# Patient Record
Sex: Male | Born: 1990 | Hispanic: No | Marital: Single | Smoking: Current some day smoker
Health system: Southern US, Community
[De-identification: ages and names within clinical notes are randomized; demographics above are authoritative.]

## PROBLEM LIST (undated history)

## (undated) HISTORY — PX: TONSILLECTOMY: SHX5217

---
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
## 2012-03-11 ENCOUNTER — Emergency Department (HOSPITAL_COMMUNITY)
Admission: EM | Admit: 2012-03-11 | Discharge: 2012-03-11 | Disposition: A | Discharge status: Home / Self Care | Payer: Self-pay | Attending: Emergency Medicine | Admitting: Emergency Medicine

## 2012-03-11 ENCOUNTER — Emergency Department (HOSPITAL_COMMUNITY): Payer: Self-pay

## 2012-03-11 ENCOUNTER — Encounter (HOSPITAL_COMMUNITY): Payer: Self-pay | Admitting: *Deleted

## 2012-03-11 DIAGNOSIS — S63509A Unspecified sprain of unspecified wrist, initial encounter: Secondary | ICD-10-CM | POA: Insufficient documentation

## 2012-03-11 DIAGNOSIS — M6740 Ganglion, unspecified site: Secondary | ICD-10-CM | POA: Insufficient documentation

## 2012-03-11 DIAGNOSIS — Y9269 Other specified industrial and construction area as the place of occurrence of the external cause: Secondary | ICD-10-CM | POA: Insufficient documentation

## 2012-03-11 DIAGNOSIS — X503XXA Overexertion from repetitive movements, initial encounter: Secondary | ICD-10-CM | POA: Insufficient documentation

## 2012-03-11 DIAGNOSIS — Y990 Civilian activity done for income or pay: Secondary | ICD-10-CM | POA: Insufficient documentation

## 2012-03-11 MED ORDER — IBUPROFEN 800 MG PO TABS: 800.0000 mg | ORAL_TABLET | Freq: Three times a day (TID) | ORAL | Status: AC

## 2012-03-11 NOTE — Discharge Instructions (Signed)
Follow up with your primary care doctor or with Dr. Merlyn Lot for further evaluation if no better in 4-5 days or if symptoms become worse.  Sprain A sprain happens when the bands of tissue that connect bones and hold joints together (ligaments) stretch too much or tear. HOME CARE  Raise (elevate) the injured area to lessen puffiness (swelling).   Put ice on the injured area.   Put ice in a plastic bag.   Place a towel between your skin and the bag.   Leave the ice on for 15 to 20 minutes, 3 to 4 times a day.   Do this for the first 24 hours or as told by your doctor.   Wear any splints, braces, castings, or elastic wraps as told by your child's doctor.   Eat healthy foods.   Ganglion A ganglion is a swelling under the skin that is filled with a thick, jelly-like substance. It is a synovial cyst. This is caused by a break (rupture) of the joint lining from the joint space. A ganglion often occurs near an area of repeated minor trauma (damage caused by an accident). Trauma may also be a repetitive movement at work or in a sport. TREATMENT  It often goes away without treatment. It may reappear later. Sometimes a ganglion may need to be surgically removed. Often they are drained and injected with a steroid. Sometimes they respond to:  Rest.   Splinting.  HOME CARE INSTRUCTIONS   Your caregiver will decide the best way of treating your ganglion. Do not try to break the ganglion yourself by pressing on it, poking it with a needle, or hitting it with a heavy object.   Use medications as directed.  SEEK MEDICAL CARE IF:   The ganglion becomes larger or more painful.   You have increased redness or swelling.   You have weakness or numbness in your hand or wrist.  MAKE SURE YOU:   Understand these instructions.   Will watch your condition.   Will get help right away if you are not doing well or get worse.  Document Released: 11/07/2000 Document Revised: 10/30/2011 Document  Reviewed: 01/04/2008 [facility] Patient Information 2012 [place], [place].  Only take medicine as told by your doctor.  GET HELP RIGHT AWAY IF:   There is numbness or tingling in the injured limb.   The toes or fingers become blue or white in the injured limb.   The sprained limb is cold to the touch.   There is a sharp, shooting pain in the injured limb.   The puffiness is getting worse instead of better.  MAKE SURE YOU:   Understand these instructions.   Will watch this condition.   Will get help right away if you are not doing well or get worse.  Document Released: 04/28/2008 Document Revised: 10/30/2011 Document Reviewed: 09/26/2009 [facility] Patient Information 2012 [place], [place].

## 2012-03-11 NOTE — ED Provider Notes (Signed)
History     CSN: [id number]  Arrival date & time 03/11/12  2033   None     Chief Complaint  Patient presents with  . Wrist Pain    pt denies known injury to right wrist. states he does repeated heavy lifting at work, states he noticed the pain started 2 days ago and reports increased pain with plantar flexion of wrist.    (Consider location/radiation/quality/duration/timing/severity/associated sxs/prior treatment) Patient is a 21 y.o. male presenting with wrist pain. The history is provided by the patient.  Wrist Pain This is a new problem. The current episode started in the past 7 days. The problem occurs constantly. The problem has been unchanged. Pertinent negatives include no numbness. Associated symptoms comments: He does heavy lifting at work and noticed increasing right wrist soreness x 2 days, now with a cyst like swelling that is painful..    Past Medical History  Diagnosis Date  . Asthma     History reviewed. No pertinent past surgical history.  History reviewed. No pertinent family history.  History  Substance Use Topics  . Smoking status: Not on file  . Smokeless tobacco: Not on file  . Alcohol Use: Yes      Review of Systems  Musculoskeletal:       See HPI  Skin: Negative for wound.  Neurological: Negative for numbness.    Allergies  Review of patient's allergies indicates no known allergies.  Home Medications   Current Outpatient Rx  Name Route Sig Dispense Refill  . CETIRIZINE HCL 10 MG PO TABS Oral Take 10 mg by mouth daily.    . IBUPROFEN 200 MG PO TABS Oral Take 600 mg by mouth every 8 (eight) hours as needed. For pain.      BP 155/98  Pulse 76  Temp(Src) 98.3 F (36.8 C) (Oral)  Resp 20  Ht 5\' 11"  (1.803 m)  Wt 193 lb 3.2 oz (87.635 kg)  BMI 26.95 kg/m2  SpO2 98%  Physical Exam  Constitutional: He is oriented to person, place, and time. He appears well-developed and well-nourished.  Pulmonary/Chest: Effort normal.    Musculoskeletal: Normal range of motion.       Right wrist without swelling, discoloration or bony deformity. FROM, with no deficits of strength or tendon function. Approximate 2 cm cyst dorsally c/w ganglion cyst.   Neurological: He is alert and oriented to person, place, and time.  Skin: Skin is warm and dry.  Psychiatric: He has a normal mood and affect.    ED Course  Procedures (including critical care time)  Labs Reviewed - No data to display Dg Wrist Complete Right  03/11/2012  *RADIOLOGY REPORT*  Clinical Data: New.  RIGHT WRIST - COMPLETE 3+ VIEW  Comparison: None.  Findings: The right wrist is located.  No acute bone or soft tissue abnormality is evident.  IMPRESSION: Negative right wrist.  Original Report Authenticated By: Jamesetta Orleans. MATTERN, M.D.     No diagnosis found. 1. Wrist sprain, mild 2. Ganglion cyst   MDM          Rodena Medin, PA-C 03/11/12 2232

## 2012-03-11 NOTE — ED Provider Notes (Signed)
Medical screening examination/treatment/procedure(s) were performed by non-physician practitioner and as supervising physician I was immediately available for consultation/collaboration.  Cheri Guppy, MD 03/11/12 [phone]

## 2013-12-09 IMAGING — CR DG WRIST COMPLETE 3+V*R*
4 series · 4 of 4 positions shown · non-contrast
Comparison: None.

CLINICAL DATA: New.

RIGHT WRIST - COMPLETE 3+ VIEW

[x wrist pa right]
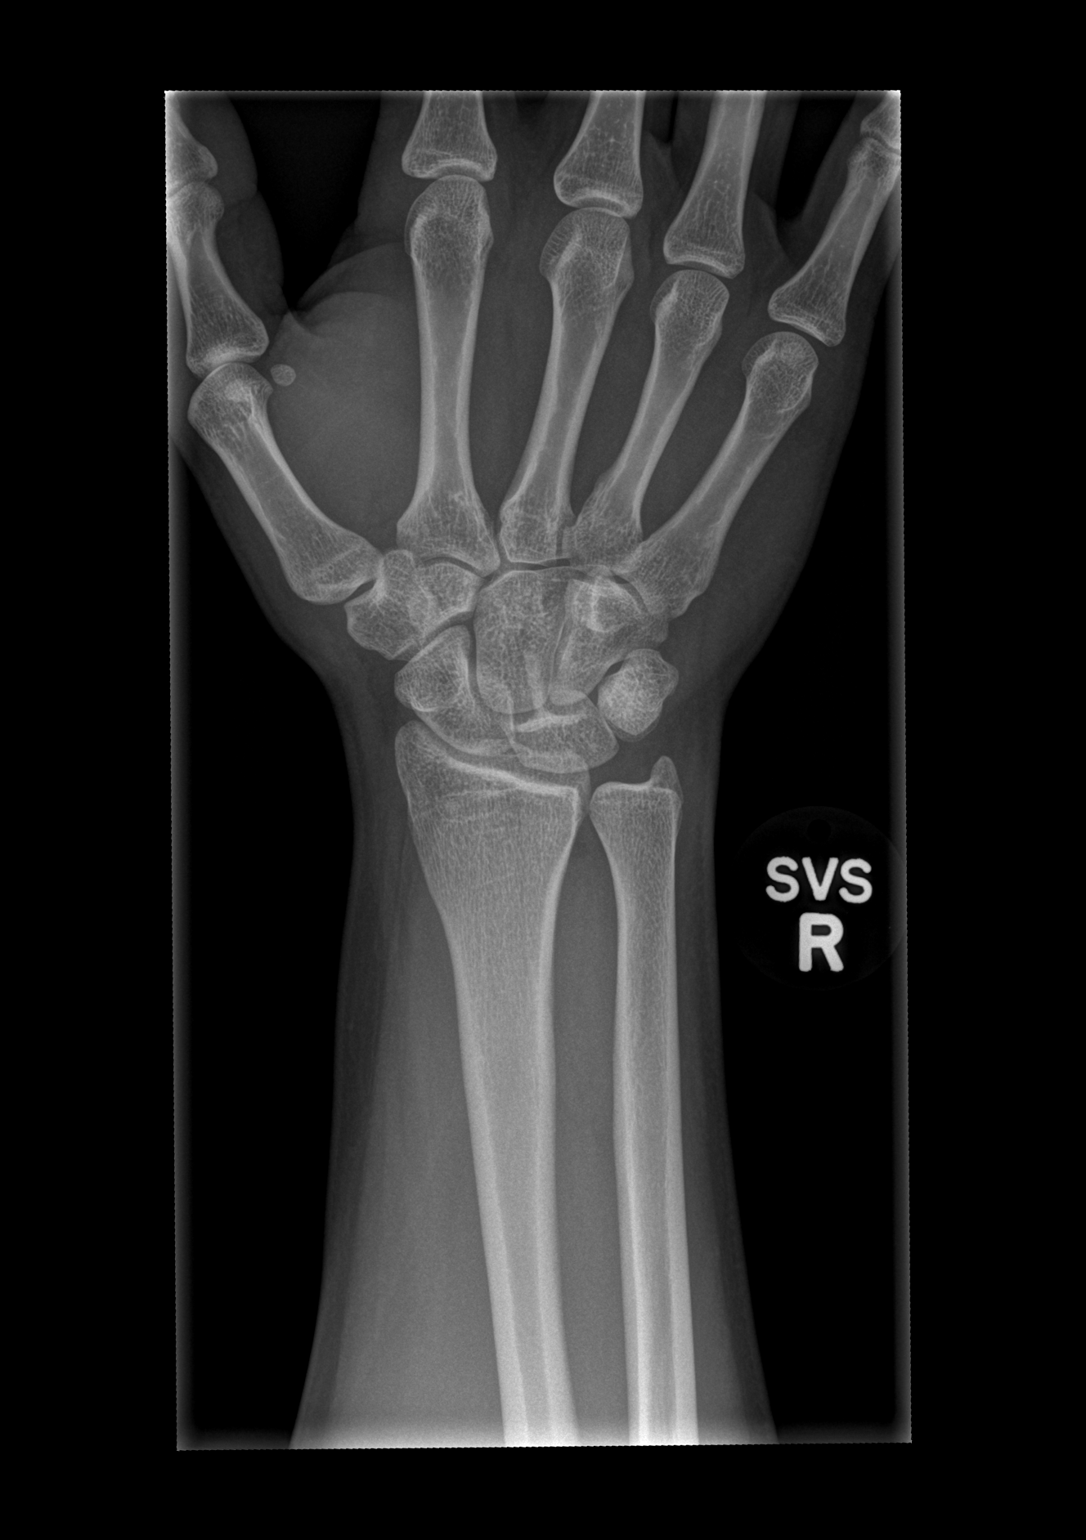

[x wrist obl right]
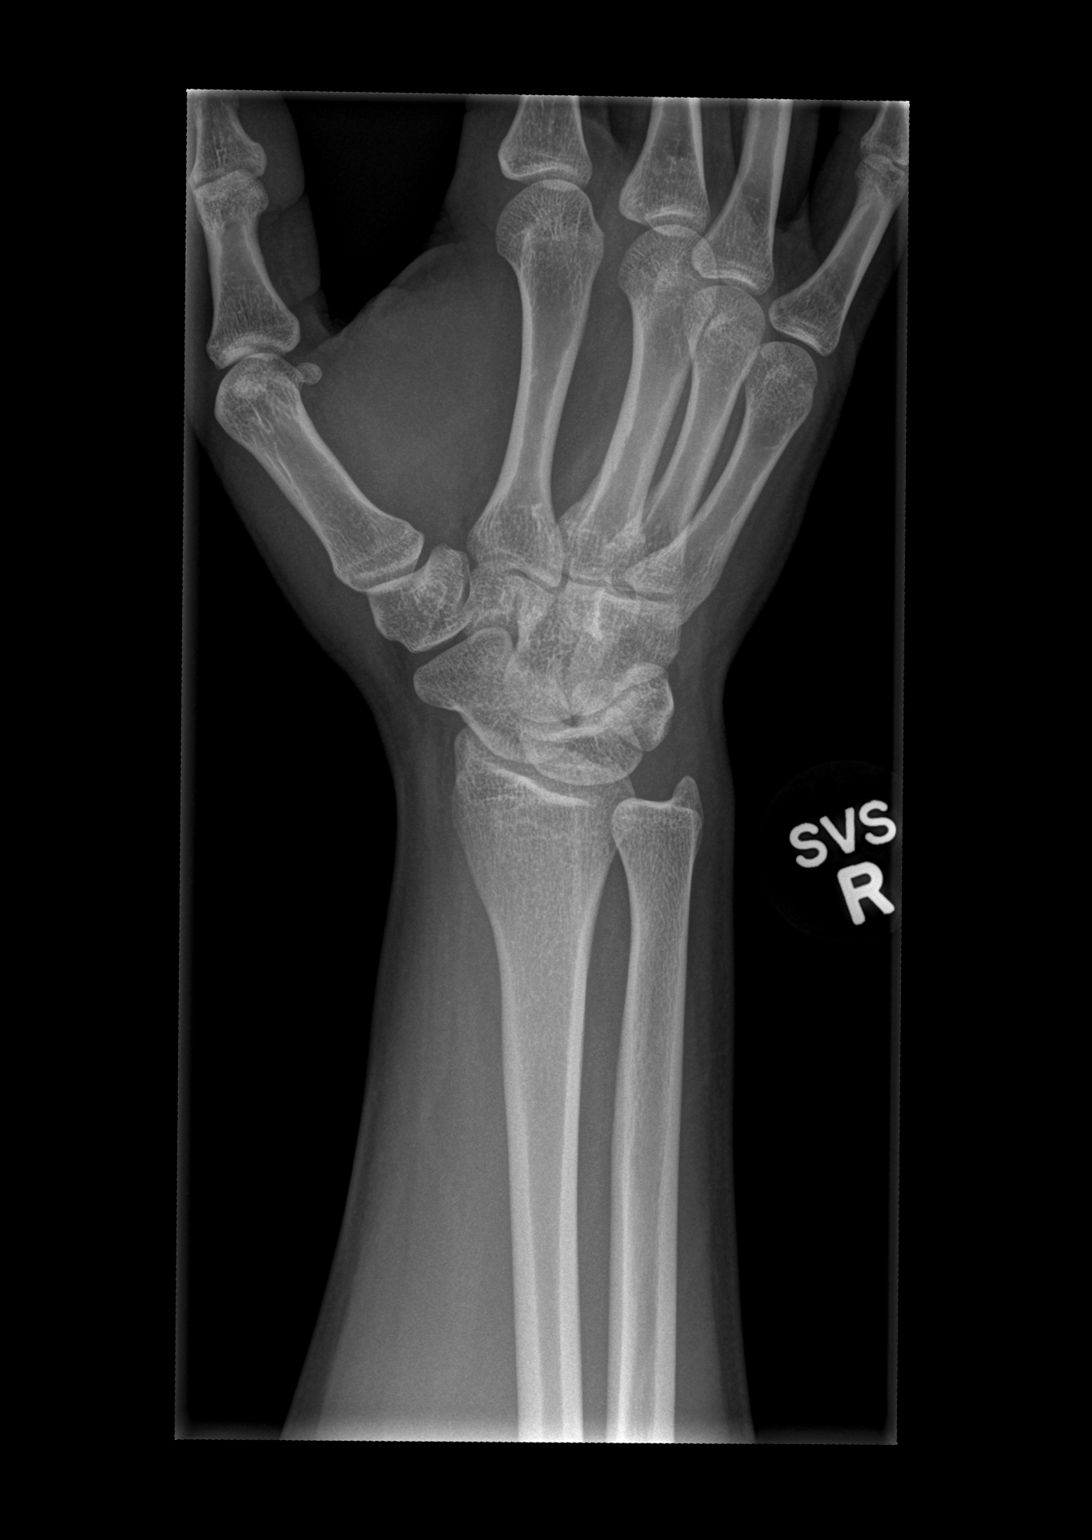

[x wrist lat right]
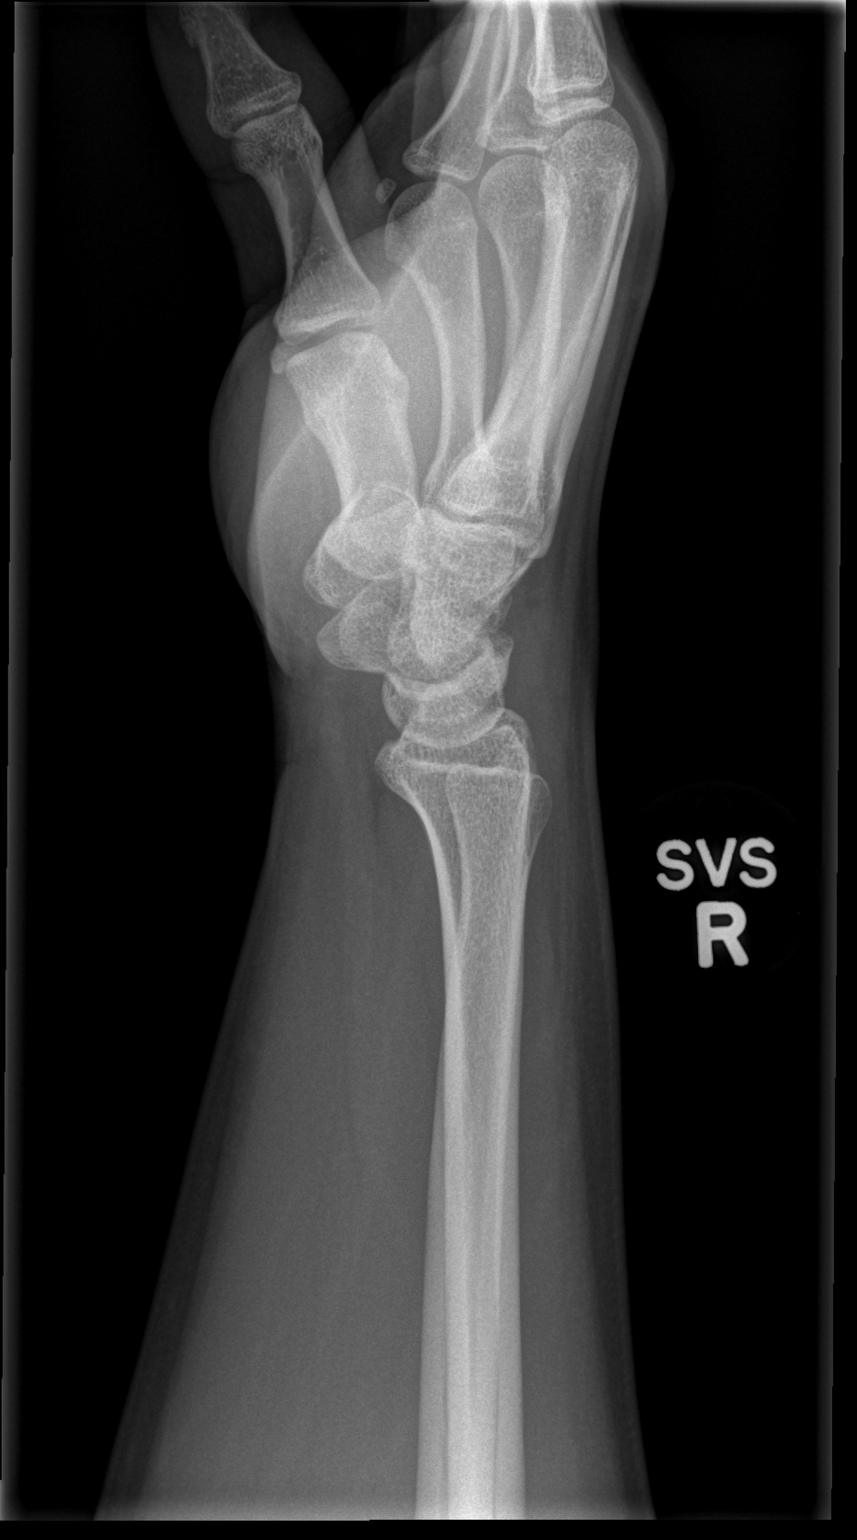

[x wrist navicular view right]
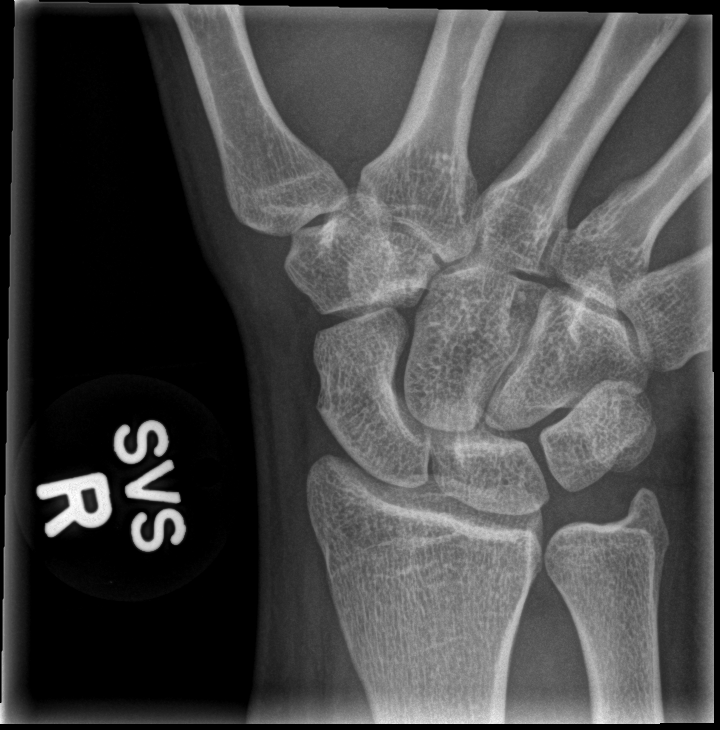

[4 of 4 positions shown; findings below may reference images not displayed]

FINDINGS: The right wrist is located.  No acute bone or soft tissue
abnormality is evident.
IMPRESSION: Negative right wrist.

## 2019-09-12 ENCOUNTER — Other Ambulatory Visit: Payer: Self-pay

## 2019-09-12 DIAGNOSIS — Z20822 Contact with and (suspected) exposure to covid-19: Secondary | ICD-10-CM

## 2019-09-14 LAB — NOVEL CORONAVIRUS, NAA: SARS-CoV-2, NAA: NOT DETECTED

## 2019-09-16 ENCOUNTER — Other Ambulatory Visit: Payer: Self-pay

## 2019-09-16 DIAGNOSIS — Z20822 Contact with and (suspected) exposure to covid-19: Secondary | ICD-10-CM

## 2019-09-17 LAB — NOVEL CORONAVIRUS, NAA: SARS-CoV-2, NAA: NOT DETECTED

## 2019-09-30 ENCOUNTER — Other Ambulatory Visit: Payer: Self-pay

## 2019-09-30 DIAGNOSIS — Z20822 Contact with and (suspected) exposure to covid-19: Secondary | ICD-10-CM

## 2019-10-01 LAB — NOVEL CORONAVIRUS, NAA: SARS-CoV-2, NAA: NOT DETECTED

## 2021-12-27 ENCOUNTER — Other Ambulatory Visit (HOSPITAL_BASED_OUTPATIENT_CLINIC_OR_DEPARTMENT_OTHER): Payer: Self-pay

## 2021-12-27 DIAGNOSIS — R0683 Snoring: Secondary | ICD-10-CM

## 2022-02-10 ENCOUNTER — Ambulatory Visit (HOSPITAL_BASED_OUTPATIENT_CLINIC_OR_DEPARTMENT_OTHER): Payer: Self-pay | Attending: Physician Assistant | Admitting: Internal Medicine

## 2022-07-17 ENCOUNTER — Ambulatory Visit (HOSPITAL_COMMUNITY): Payer: Self-pay | Admitting: Psychiatry

## 2022-08-26 ENCOUNTER — Ambulatory Visit (HOSPITAL_BASED_OUTPATIENT_CLINIC_OR_DEPARTMENT_OTHER): Payer: Commercial Managed Care - HMO | Admitting: Psychiatry

## 2022-08-26 ENCOUNTER — Encounter (HOSPITAL_COMMUNITY): Payer: Self-pay | Admitting: Psychiatry

## 2022-08-26 VITALS — Wt 200.0 lb

## 2022-08-26 DIAGNOSIS — T1490XA Injury, unspecified, initial encounter: Secondary | ICD-10-CM

## 2022-08-26 DIAGNOSIS — F331 Major depressive disorder, recurrent, moderate: Secondary | ICD-10-CM | POA: Diagnosis not present

## 2022-08-26 DIAGNOSIS — F4310 Post-traumatic stress disorder, unspecified: Secondary | ICD-10-CM

## 2022-08-26 MED ORDER — BUPROPION HCL ER (XL) 300 MG PO TB24: 300.0000 mg | ORAL_TABLET | ORAL | 1 refills | Status: DC

## 2022-08-26 MED ORDER — HYDROXYZINE HCL 10 MG PO TABS: ORAL_TABLET | ORAL | 0 refills | Status: DC
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
## 2022-09-15 ENCOUNTER — Telehealth (HOSPITAL_COMMUNITY): Payer: Commercial Managed Care - HMO | Admitting: Psychiatry

## 2022-09-22 ENCOUNTER — Encounter (HOSPITAL_COMMUNITY): Payer: Self-pay | Admitting: Psychiatry

## 2022-09-22 ENCOUNTER — Telehealth (HOSPITAL_BASED_OUTPATIENT_CLINIC_OR_DEPARTMENT_OTHER): Payer: Commercial Managed Care - HMO | Admitting: Psychiatry

## 2022-09-22 VITALS — Wt 200.0 lb

## 2022-09-22 DIAGNOSIS — F4310 Post-traumatic stress disorder, unspecified: Secondary | ICD-10-CM

## 2022-09-22 DIAGNOSIS — F331 Major depressive disorder, recurrent, moderate: Secondary | ICD-10-CM

## 2022-09-22 MED ORDER — BUPROPION HCL ER (XL) 300 MG PO TB24: 300.0000 mg | ORAL_TABLET | ORAL | 1 refills | Status: DC

## 2022-09-22 MED ORDER — HYDROXYZINE HCL 10 MG PO TABS: ORAL_TABLET | ORAL | 1 refills | Status: DC
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
## 2022-11-03 ENCOUNTER — Telehealth (HOSPITAL_COMMUNITY): Payer: Commercial Managed Care - HMO | Admitting: Psychiatry

## 2022-11-04 ENCOUNTER — Telehealth (HOSPITAL_COMMUNITY): Payer: Commercial Managed Care - HMO | Admitting: Psychiatry

## 2022-11-11 ENCOUNTER — Encounter (HOSPITAL_COMMUNITY): Payer: Self-pay | Admitting: Psychiatry

## 2022-11-11 ENCOUNTER — Telehealth (HOSPITAL_BASED_OUTPATIENT_CLINIC_OR_DEPARTMENT_OTHER): Payer: Commercial Managed Care - HMO | Admitting: Psychiatry

## 2022-11-11 VITALS — Wt 200.0 lb

## 2022-11-11 DIAGNOSIS — F4310 Post-traumatic stress disorder, unspecified: Secondary | ICD-10-CM

## 2022-11-11 DIAGNOSIS — F1010 Alcohol abuse, uncomplicated: Secondary | ICD-10-CM | POA: Diagnosis not present

## 2022-11-11 DIAGNOSIS — F331 Major depressive disorder, recurrent, moderate: Secondary | ICD-10-CM | POA: Diagnosis not present

## 2022-11-11 MED ORDER — BUPROPION HCL ER (XL) 150 MG PO TB24: 150.0000 mg | ORAL_TABLET | ORAL | 0 refills | Status: DC

## 2022-11-11 MED ORDER — LAMOTRIGINE 25 MG PO TABS: ORAL_TABLET | ORAL | 0 refills | Status: DC
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
## 2022-12-06 ENCOUNTER — Encounter (HOSPITAL_BASED_OUTPATIENT_CLINIC_OR_DEPARTMENT_OTHER): Payer: Self-pay | Admitting: Radiology

## 2022-12-06 ENCOUNTER — Other Ambulatory Visit: Payer: Self-pay

## 2022-12-06 ENCOUNTER — Emergency Department (HOSPITAL_BASED_OUTPATIENT_CLINIC_OR_DEPARTMENT_OTHER)
Admission: EM | Admit: 2022-12-06 | Discharge: 2022-12-06 | Disposition: A | Discharge status: Home / Self Care | Payer: No Typology Code available for payment source | Attending: Emergency Medicine | Admitting: Emergency Medicine

## 2022-12-06 ENCOUNTER — Other Ambulatory Visit (HOSPITAL_COMMUNITY): Payer: Self-pay | Admitting: Psychiatry

## 2022-12-06 DIAGNOSIS — F4310 Post-traumatic stress disorder, unspecified: Secondary | ICD-10-CM

## 2022-12-06 DIAGNOSIS — K047 Periapical abscess without sinus: Secondary | ICD-10-CM | POA: Insufficient documentation

## 2022-12-06 DIAGNOSIS — R220 Localized swelling, mass and lump, head: Secondary | ICD-10-CM | POA: Diagnosis present

## 2022-12-06 DIAGNOSIS — F331 Major depressive disorder, recurrent, moderate: Secondary | ICD-10-CM

## 2022-12-06 MED ORDER — IBUPROFEN 400 MG PO TABS
600.0000 mg | ORAL_TABLET | Freq: Once | ORAL | Status: AC
  Administered 2022-12-06: 600 mg via ORAL
  Filled 2022-12-06: qty 1

## 2022-12-06 MED ORDER — AMOXICILLIN-POT CLAVULANATE 875-125 MG PO TABS: 1.0000 | ORAL_TABLET | Freq: Two times a day (BID) | ORAL | 0 refills | Status: DC

## 2022-12-06 MED ORDER — AMOXICILLIN-POT CLAVULANATE 875-125 MG PO TABS
1.0000 | ORAL_TABLET | Freq: Once | ORAL | Status: AC
  Administered 2022-12-06: 1 via ORAL
  Filled 2022-12-06: qty 1

## 2022-12-06 MED ORDER — IBUPROFEN 600 MG PO TABS: 600.0000 mg | ORAL_TABLET | Freq: Four times a day (QID) | ORAL | 0 refills | Status: AC | PRN

## 2022-12-06 NOTE — ED Provider Notes (Signed)
[facility] DEPT  Provider Note  CSN: [id number] Arrival date & time: 12/06/22 0136  History Chief Complaint  Patient presents with   Oral Swelling    Mitchell Skinner is a 32 y.o. male reports a sore on the roof of his mouth, adjacent to his L 3rd molar onset about 24 hours ago with significant pain. The sore burst while in the waiting room and pus came out. He reports long standing issues with what he believes is sleep apnea and sore throat. No fever tonight.    Home Medications Prior to Admission medications   Medication Sig Start Date End Date Taking? Authorizing Provider  amoxicillin-clavulanate (AUGMENTIN) 875-125 MG tablet Take 1 tablet by mouth every 12 (twelve) hours. 12/06/22  Yes Truddie Hidden, MD  ibuprofen (ADVIL) 600 MG tablet Take 1 tablet (600 mg total) by mouth every 6 (six) hours as needed. 12/06/22  Yes Truddie Hidden, MD  buPROPion (WELLBUTRIN XL) 150 MG 24 hr tablet Take 1 tablet (150 mg total) by mouth every morning. 11/11/22 11/11/23  Arfeen, Arlyce Harman, MD  cetirizine (ZYRTEC) 10 MG tablet Take 10 mg by mouth daily.    [provider]  hydrOXYzine (ATARAX) 10 MG tablet Take one to two tab at bed time. 09/22/22   Arfeen, Arlyce Harman, MD  lamoTRIgine (LAMICTAL) 25 MG tablet Take one tab daily for one week and than two tab daily 11/11/22   Arfeen, Arlyce Harman, MD  loratadine (CLARITIN) 10 MG tablet Take by mouth.    [provider]     Allergies    Cherry   Review of Systems   Review of Systems Please see HPI for pertinent positives and negatives  Physical Exam BP (!) 123/94 (BP Location: Right Arm)   Pulse 95   Temp 98.2 F (36.8 C) (Oral)   Resp 18   Ht 5\' 10"  (1.778 m)   Wt 104.3 kg   SpO2 99%   BMI 33.00 kg/m   Physical Exam Vitals and nursing note reviewed.  HENT:     Head: Normocephalic.     Nose: Nose normal.     Mouth/Throat:     Comments: Soft tissue erythema and induration without fluctuance on  his hard palate adjacent to the L upper 3rd molar which is also tender Eyes:     Extraocular Movements: Extraocular movements intact.  Pulmonary:     Effort: Pulmonary effort is normal.     Breath sounds: No stridor.  Musculoskeletal:        General: Normal range of motion.     Cervical back: Neck supple.  Skin:    Findings: No rash (on exposed skin).  Neurological:     Mental Status: He is alert and oriented to person, place, and time.  Psychiatric:        Mood and Affect: Mood normal.     ED Results / Procedures / Treatments   EKG None  Procedures Procedures  Medications Ordered in the ED Medications  ibuprofen (ADVIL) tablet 600 mg (has no administration in time range)  amoxicillin-clavulanate (AUGMENTIN) 875-125 MG per tablet 1 tablet (has no administration in time range)    Initial Impression and Plan  Patient here with apparent dental abscess which has spontaneously drained. Plan Rx for motrin and Abx. Dental follow up. RTED for any other concerns.   ED Course       MDM Rules/Calculators/A&P Medical Decision Making Problems Addressed: Dental abscess: acute illness or injury  Risk  Prescription drug management.    Final Clinical Impression(s) / ED Diagnoses Final diagnoses:  Dental abscess    Rx / DC Orders ED Discharge Orders          Ordered    amoxicillin-clavulanate (AUGMENTIN) 875-125 MG tablet  Every 12 hours        12/06/22 0241    ibuprofen (ADVIL) 600 MG tablet  Every 6 hours PRN        12/06/22 0241             Truddie Hidden, MD 12/06/22 [phone]

## 2022-12-06 NOTE — ED Triage Notes (Signed)
Pt has a sore to his left upper mouth. States it was a puss pocket that burst while he was waiting in the lobby. Pt states its affecting his speech and making his throat hurt.

## 2022-12-09 ENCOUNTER — Encounter (HOSPITAL_COMMUNITY): Payer: Self-pay | Admitting: Psychiatry

## 2022-12-09 ENCOUNTER — Telehealth (HOSPITAL_BASED_OUTPATIENT_CLINIC_OR_DEPARTMENT_OTHER): Payer: No Typology Code available for payment source | Admitting: Psychiatry

## 2022-12-09 DIAGNOSIS — F4310 Post-traumatic stress disorder, unspecified: Secondary | ICD-10-CM | POA: Diagnosis not present

## 2022-12-09 DIAGNOSIS — F331 Major depressive disorder, recurrent, moderate: Secondary | ICD-10-CM

## 2022-12-09 MED ORDER — BUPROPION HCL ER (XL) 150 MG PO TB24: 150.0000 mg | ORAL_TABLET | ORAL | 1 refills | Status: DC
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
## 2022-12-10 ENCOUNTER — Telehealth (HOSPITAL_COMMUNITY): Payer: Commercial Managed Care - HMO | Admitting: Psychiatry

## 2023-02-09 ENCOUNTER — Telehealth (HOSPITAL_BASED_OUTPATIENT_CLINIC_OR_DEPARTMENT_OTHER): Payer: No Typology Code available for payment source | Admitting: Psychiatry

## 2023-02-09 DIAGNOSIS — F331 Major depressive disorder, recurrent, moderate: Secondary | ICD-10-CM | POA: Diagnosis not present

## 2023-02-09 DIAGNOSIS — F1010 Alcohol abuse, uncomplicated: Secondary | ICD-10-CM | POA: Diagnosis not present

## 2023-02-09 DIAGNOSIS — F4310 Post-traumatic stress disorder, unspecified: Secondary | ICD-10-CM

## 2023-02-09 MED ORDER — BUPROPION HCL ER (XL) 150 MG PO TB24: 150.0000 mg | ORAL_TABLET | ORAL | 1 refills | Status: DC

## 2023-02-09 MED ORDER — PRAZOSIN HCL 1 MG PO CAPS: 1.0000 mg | ORAL_CAPSULE | Freq: Every day | ORAL | 1 refills | Status: DC
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
## 2023-02-12 ENCOUNTER — Telehealth: Payer: No Typology Code available for payment source | Admitting: Physician Assistant

## 2023-02-12 DIAGNOSIS — F1010 Alcohol abuse, uncomplicated: Secondary | ICD-10-CM

## 2023-02-12 NOTE — Progress Notes (Signed)
Virtual Visit Consent   Seng Polishchuk. Hawes, you are scheduled for a virtual visit with a [facility] provider today. Just as with appointments in the office, your consent must be obtained to participate. Your consent will be active for this visit and any virtual visit you may have with one of our providers in the next 365 days. If you have a MyChart account, a copy of this consent can be sent to you electronically.  As this is a virtual visit, video technology does not allow for your provider to perform a traditional examination. This may limit your provider's ability to fully assess your condition. If your provider identifies any concerns that need to be evaluated in person or the need to arrange testing (such as labs, EKG, etc.), we will make arrangements to do so. Although advances in technology are sophisticated, we cannot ensure that it will always work on either your end or our end. If the connection with a video visit is poor, the visit may have to be switched to a telephone visit. With either a video or telephone visit, we are not always able to ensure that we have a secure connection.  By engaging in this virtual visit, you consent to the provision of healthcare and authorize for your insurance to be billed (if applicable) for the services provided during this visit. Depending on your insurance coverage, you may receive a charge related to this service.  I need to obtain your verbal consent now. Are you willing to proceed with your visit today? Mitchell Clam Nafus has provided verbal consent on 02/12/2023 for a virtual visit (video or telephone). Mitchell Skinner, [place]  Date: 02/12/2023 1:48 PM  Virtual Visit via Video Note   I, Mitchell Skinner, connected with  Mitchell Clam. Podgorny  ([id number], [birth year]) on 02/12/23 at  1:30 PM EDT by a video-enabled telemedicine application and verified that I am speaking with the correct person using two identifiers.  Location: Patient: Virtual Visit  Location Patient: Home Provider: Virtual Visit Location Provider: Home Office   I discussed the limitations of evaluation and management by telemedicine and the availability of in person appointments. The patient expressed understanding and agreed to proceed.    History of Present Illness: Mitchell Skinner is a 32 y.o. who identifies as a male who was assigned male at birth, and is being seen today for alcohol misuse in relation to worsening depression. Notes his fiance left him recently which has caused substantial increase in depressive symptoms, although he denies SI/HI. Notes he has been drinking a lot for the past 6 months, including the need to take shots before going to work in order to "start his day". Over the past 3 days endorses drinking a handle of liquor daily (~1.75L). Notes no appetite. Sleep is very poor. Was evaluated by his Psychiatrist (Dr. Adele Schilder) on 3/18 at which time it was noted he had been out of Wellbutrin. This was refilled along with addition of Prazosin to help with his nightmares..  States he has not taken the Prazosin. Is no longer taking his Lamotrigine. Picked up Wellbutrin refill yesterday but now cannot find it. Notes if he goes a while without a drink he will get tremors Denies any AMS. Giving drinking a close friend removed sharp objects and his handgun to be cautious. Notes he did not notify his specialist about how substantial alcohol use was.  HPI: HPI  Problems: There are no problems to display for this patient.   Allergies:  Allergies  Allergen Reactions   Cherry Hives   Medications:  Current Outpatient Medications:    amoxicillin-clavulanate (AUGMENTIN) 875-125 MG tablet, Take 1 tablet by mouth every 12 (twelve) hours., Disp: 14 tablet, Rfl: 0   buPROPion (WELLBUTRIN XL) 150 MG 24 hr tablet, Take 1 tablet (150 mg total) by mouth every morning., Disp: 30 tablet, Rfl: 1   cetirizine (ZYRTEC) 10 MG tablet, Take 10 mg by mouth daily., Disp: , Rfl:     ibuprofen (ADVIL) 600 MG tablet, Take 1 tablet (600 mg total) by mouth every 6 (six) hours as needed., Disp: 30 tablet, Rfl: 0   loratadine (CLARITIN) 10 MG tablet, Take by mouth., Disp: , Rfl:    prazosin (MINIPRESS) 1 MG capsule, Take 1 capsule (1 mg total) by mouth at bedtime., Disp: 30 capsule, Rfl: 1  Observations/Objective: Patient is well-developed, well-nourished in no acute distress.  Resting comfortably  at home.  Head is normocephalic, atraumatic.  No labored breathing.  Speech is clear and coherent with logical content.  Patient is alert and oriented at baseline.   Assessment and Plan: 1. Alcohol abuse  Associated with an adjustment reaction and his chronic MDD. Substantial use with high risk of withdrawals if trying to cut out on his own. Needs supervision. Want him to first reach out to his Psychiatrist for initial instructions and to help get him in a proper program. ER precautions reviewed.   Follow Up Instructions: I discussed the assessment and treatment plan with the patient. The patient was provided an opportunity to ask questions and all were answered. The patient agreed with the plan and demonstrated an understanding of the instructions.  A copy of instructions were sent to the patient via MyChart unless otherwise noted below.   The patient was advised to call back or seek an in-person evaluation if the symptoms worsen or if the condition fails to improve as anticipated.  Time:  I spent 12 minutes with the patient via telehealth technology discussing the above problems/concerns.    Mitchell Rio, PA-C

## 2023-02-12 NOTE — Patient Instructions (Signed)
  Lanier Clam Marin Shutter, thank you for joining [name], PA-C for today's virtual visit.  While this provider is not your primary care provider (PCP), if your PCP is located in our provider database this encounter information will be shared with them immediately following your visit.   [facility] account gives you access to today's visit and all your visits, tests, and labs performed at [facility] " click here if you don't have a [facility] account or go to [email or web address]  Consent: (Patient) Mitchell Skinner. Riskin provided verbal consent for this virtual visit at the beginning of the encounter.  Current Medications:  Current Outpatient Medications:    amoxicillin-clavulanate (AUGMENTIN) 875-125 MG tablet, Take 1 tablet by mouth every 12 (twelve) hours., Disp: 14 tablet, Rfl: 0   buPROPion (WELLBUTRIN XL) 150 MG 24 hr tablet, Take 1 tablet (150 mg total) by mouth every morning., Disp: 30 tablet, Rfl: 1   cetirizine (ZYRTEC) 10 MG tablet, Take 10 mg by mouth daily., Disp: , Rfl:    hydrOXYzine (ATARAX) 10 MG tablet, Take one to two tab at bed time., Disp: 60 tablet, Rfl: 1   ibuprofen (ADVIL) 600 MG tablet, Take 1 tablet (600 mg total) by mouth every 6 (six) hours as needed., Disp: 30 tablet, Rfl: 0   lamoTRIgine (LAMICTAL) 25 MG tablet, Take one tab daily for one week and than two tab daily, Disp: 60 tablet, Rfl: 0   loratadine (CLARITIN) 10 MG tablet, Take by mouth., Disp: , Rfl:    prazosin (MINIPRESS) 1 MG capsule, Take 1 capsule (1 mg total) by mouth at bedtime., Disp: 30 capsule, Rfl: 1   Medications ordered in this encounter:  No orders of the defined types were placed in this encounter.    *If you need refills on other medications prior to your next appointment, please contact your pharmacy*  Follow-Up: Call back or seek an in-person evaluation if the symptoms worsen or if the condition fails to improve as anticipated.  [facility] [phone]  Other Instructions Please contact Dr. Darletta Moll office immediately at [phone] to let them know (1) about loss of Wellbutrin (Bupropion) and (2) the substantial amount of alcohol you are consuming so they can get you treated. This is above the scope of what can be done via a video urgent care visit. If they are unable to get you taken care of, I want you to be evaluated at the nearest ER due to high risk of withdrawals from trying to cut out the drinking yourself.   [facility] [place] [facility] [phone] [facility] [phone]     If you have been instructed to have an in-person evaluation today at a local Urgent Care facility, please use the link below. It will take you to a list of all of our available [facility], including address, phone number and hours of operation. Please do not delay care.  [facility]  If you or a family member do not have a primary care provider, use the link below to schedule a visit and establish care. When you choose a [facility] primary care physician or advanced practice provider, you gain a long-term partner in health. Find a Primary Care Provider  Learn more about [facility]'s in-office and virtual care options: [facility]

## 2023-02-14 ENCOUNTER — Ambulatory Visit (HOSPITAL_COMMUNITY)
Admission: EM | Admit: 2023-02-14 | Discharge: 2023-02-14 | Disposition: A | Discharge status: Other Acute Inpatient Hospital | Payer: No Typology Code available for payment source | Attending: Urology | Admitting: Urology

## 2023-02-14 ENCOUNTER — Other Ambulatory Visit: Payer: Self-pay

## 2023-02-14 ENCOUNTER — Encounter (HOSPITAL_COMMUNITY): Payer: Self-pay

## 2023-02-14 ENCOUNTER — Emergency Department (HOSPITAL_COMMUNITY)
Admission: EM | Admit: 2023-02-14 | Discharge: 2023-02-15 | Disposition: A | Discharge status: Other Acute Inpatient Hospital | Payer: No Typology Code available for payment source | Attending: Emergency Medicine | Admitting: Emergency Medicine

## 2023-02-14 DIAGNOSIS — F1020 Alcohol dependence, uncomplicated: Secondary | ICD-10-CM

## 2023-02-14 DIAGNOSIS — F10929 Alcohol use, unspecified with intoxication, unspecified: Secondary | ICD-10-CM

## 2023-02-14 DIAGNOSIS — Z20822 Contact with and (suspected) exposure to covid-19: Secondary | ICD-10-CM | POA: Insufficient documentation

## 2023-02-14 DIAGNOSIS — Z811 Family history of alcohol abuse and dependence: Secondary | ICD-10-CM | POA: Diagnosis not present

## 2023-02-14 DIAGNOSIS — R112 Nausea with vomiting, unspecified: Secondary | ICD-10-CM | POA: Diagnosis present

## 2023-02-14 DIAGNOSIS — Z818 Family history of other mental and behavioral disorders: Secondary | ICD-10-CM | POA: Diagnosis not present

## 2023-02-14 DIAGNOSIS — E876 Hypokalemia: Secondary | ICD-10-CM | POA: Diagnosis not present

## 2023-02-14 DIAGNOSIS — F332 Major depressive disorder, recurrent severe without psychotic features: Secondary | ICD-10-CM | POA: Diagnosis not present

## 2023-02-14 DIAGNOSIS — F10220 Alcohol dependence with intoxication, uncomplicated: Secondary | ICD-10-CM | POA: Insufficient documentation

## 2023-02-14 LAB — COMPREHENSIVE METABOLIC PANEL
ALT: 67 U/L — ABNORMAL HIGH (ref 0–44)
AST: 119 U/L — ABNORMAL HIGH (ref 15–41)
Albumin: 4.9 g/dL (ref 3.5–5.0)
Alkaline Phosphatase: 103 U/L (ref 38–126)
Anion gap: 15 (ref 5–15)
BUN: 10 mg/dL (ref 6–20)
CO2: 23 mmol/L (ref 22–32)
Calcium: 9.2 mg/dL (ref 8.9–10.3)
Chloride: 99 mmol/L (ref 98–111)
Creatinine, Ser: 0.83 mg/dL (ref 0.61–1.24)
GFR, Estimated: >60 mL/min (ref >60)
Glucose, Bld: 110 mg/dL — ABNORMAL HIGH (ref 70–99)
Potassium: 3.1 mmol/L — ABNORMAL LOW (ref 3.5–5.1)
Sodium: 137 mmol/L (ref 135–145)
Total Bilirubin: 1.3 mg/dL — ABNORMAL HIGH (ref 0.3–1.2)
Total Protein: 8.7 g/dL — ABNORMAL HIGH (ref 6.5–8.1)

## 2023-02-14 LAB — CBC WITH DIFFERENTIAL/PLATELET
Abs Immature Granulocytes: 0 10*3/uL (ref 0.00–0.07)
Basophils Absolute: 0 10*3/uL (ref 0.0–0.1)
Basophils Relative: 1 %
Eosinophils Absolute: 0.1 10*3/uL (ref 0.0–0.5)
Eosinophils Relative: 3 %
HCT: 48.4 % (ref 39.0–52.0)
Hemoglobin: 16.3 g/dL (ref 13.0–17.0)
Immature Granulocytes: 0 %
Lymphocytes Relative: 43 %
Lymphs Abs: 2 10*3/uL (ref 0.7–4.0)
MCH: 29.7 pg (ref 26.0–34.0)
MCHC: 33.7 g/dL (ref 30.0–36.0)
MCV: 88.2 fL (ref 80.0–100.0)
Monocytes Absolute: 0.5 10*3/uL (ref 0.1–1.0)
Monocytes Relative: 10 %
Neutro Abs: 2.1 10*3/uL (ref 1.7–7.7)
Neutrophils Relative %: 43 %
Platelets: 289 10*3/uL (ref 150–400)
RBC: 5.49 MIL/uL (ref 4.22–5.81)
RDW: 12.9 % (ref 11.5–15.5)
WBC: 4.8 10*3/uL (ref 4.0–10.5)
nRBC: 0 % (ref 0.0–0.2)

## 2023-02-14 LAB — ETHANOL: Alcohol, Ethyl (B): 230 mg/dL — ABNORMAL HIGH (ref <10)

## 2023-02-14 MED ORDER — THIAMINE HCL 100 MG/ML IJ SOLN
100.0000 mg | Freq: Once | INTRAMUSCULAR | Status: AC
  Administered 2023-02-14: 100 mg via INTRAMUSCULAR
  Filled 2023-02-14: qty 2

## 2023-02-14 MED ORDER — LORAZEPAM 1 MG PO TABS
1.0000 mg | ORAL_TABLET | Freq: Once | ORAL | Status: AC
  Filled 2023-02-14: qty 1

## 2023-02-14 MED ORDER — THIAMINE MONONITRATE 100 MG PO TABS
100.0000 mg | ORAL_TABLET | Freq: Every day | ORAL | Status: DC
  Administered 2023-02-14: 100 mg via ORAL
  Filled 2023-02-14 (×2): qty 1

## 2023-02-14 MED ORDER — ONDANSETRON HCL 4 MG/2ML IJ SOLN
4.0000 mg | Freq: Once | INTRAMUSCULAR | Status: AC
  Administered 2023-02-14: 4 mg via INTRAVENOUS
  Filled 2023-02-14: qty 2

## 2023-02-14 MED ORDER — SODIUM CHLORIDE 0.9 % IV BOLUS
1000.0000 mL | Freq: Once | INTRAVENOUS | Status: AC
  Administered 2023-02-14: 1000 mL via INTRAVENOUS

## 2023-02-14 MED ORDER — LORAZEPAM 1 MG PO TABS: 0.0000 mg | ORAL_TABLET | Freq: Four times a day (QID) | ORAL | Status: DC

## 2023-02-14 MED ORDER — NICOTINE 21 MG/24HR TD PT24
21.0000 mg | MEDICATED_PATCH | Freq: Every day | TRANSDERMAL | Status: DC
  Administered 2023-02-14: 21 mg via TRANSDERMAL
  Filled 2023-02-14: qty 1

## 2023-02-14 MED ORDER — LORAZEPAM 1 MG PO TABS: 0.0000 mg | ORAL_TABLET | Freq: Two times a day (BID) | ORAL | Status: DC

## 2023-02-14 MED ORDER — PANTOPRAZOLE SODIUM 40 MG IV SOLR
40.0000 mg | Freq: Once | INTRAVENOUS | Status: AC
  Administered 2023-02-14: 40 mg via INTRAVENOUS
  Filled 2023-02-14: qty 10

## 2023-02-14 MED ORDER — FAMOTIDINE IN NACL 20-0.9 MG/50ML-% IV SOLN
20.0000 mg | INTRAVENOUS | Status: AC
  Administered 2023-02-14: 20 mg via INTRAVENOUS
  Filled 2023-02-14: qty 50

## 2023-02-14 MED ORDER — LORAZEPAM 1 MG PO TABS
1.0000 mg | ORAL_TABLET | Freq: Once | ORAL | Status: AC
  Administered 2023-02-14: 1 mg via ORAL

## 2023-02-14 NOTE — ED Notes (Signed)
Report given to [facility] RN@[email or web address]
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
## 2023-02-14 NOTE — ED Triage Notes (Signed)
Patient from [facility], reports he is trying to detox from ETOH, endorses vomiting multiple times today and had episode that was blood tinged. [facility] sent him to be medically cleared. Last drink was at 4pm, takes 8-12 shots liquor daily.

## 2023-02-14 NOTE — ED Provider Notes (Signed)
[facility] EMERGENCY DEPARTMENT AT [facility] Provider Note   CSN: [id number] Arrival date & time: 02/14/23  2159     History  Chief Complaint  Patient presents with   Emesis   Alcohol Problem    Mitchell Skinner is a 32 y.o. male.  32 year old male with a history of alcohol abuse presents to the emergency department for medical clearance.  Presented to [facility] for alcohol detox and was transferred following reports of brown emesis.  Patient states that he becomes incredibly nauseous with vomiting when he abstains from alcohol use.  Reports that he has noted some brown streaks in his emesis, though most of his vomit is typically comprised of mucus.  He notes these streaks only after multiple episodes of vomiting.  He has tried Pepto-Bismol for nausea and stomach upset without relief.  Has diarrhea associated with his Wellbutrin, but has not noted any melena or hematochezia.  Is not on chronic anticoagulation.  Denies associated fevers.   Emesis Alcohol Problem       Home Medications Prior to Admission medications   Medication Sig Start Date End Date Taking? Authorizing Provider  albuterol (VENTOLIN HFA) 108 (90 Base) MCG/ACT inhaler Inhale 2 puffs into the lungs every 6 (six) hours as needed for wheezing or shortness of breath.   Yes [provider]  buPROPion (WELLBUTRIN XL) 150 MG 24 hr tablet Take 1 tablet (150 mg total) by mouth every morning. 02/09/23 02/09/24 Yes Arfeen, Phillips Grout, MD  cetirizine (ZYRTEC) 10 MG tablet Take 10 mg by mouth daily as needed (for seasonal allergies- when not taking Claritin).   Yes [provider]  ibuprofen (ADVIL) 600 MG tablet Take 1 tablet (600 mg total) by mouth every 6 (six) hours as needed. Patient taking differently: Take 600 mg by mouth every 6 (six) hours as needed for mild pain or headache. 12/06/22  Yes Pollyann Savoy, MD  loratadine (CLARITIN) 10 MG tablet Take 10 mg by mouth daily as needed (for seasonal  allergies- when not taking Zyrtec).   Yes [provider]  amoxicillin-clavulanate (AUGMENTIN) 875-125 MG tablet Take 1 tablet by mouth every 12 (twelve) hours. Patient not taking: Reported on 02/14/2023 12/06/22   Pollyann Savoy, MD  prazosin (MINIPRESS) 1 MG capsule Take 1 capsule (1 mg total) by mouth at bedtime. Patient not taking: Reported on 02/14/2023 02/09/23   Cleotis Nipper, MD      Allergies    Atarax [hydroxyzine], Valentino Saxon, and Dill oil    Review of Systems   Review of Systems  Gastrointestinal:  Positive for vomiting.  Ten systems reviewed and are negative for acute change, except as noted in the HPI.    Physical Exam Updated Vital Signs BP 136/82 (BP Location: Left Arm)   Pulse 87   Temp 97.6 F (36.4 C) (Oral)   Resp 19   Ht 5\' 10"  (1.778 m)   Wt 104.3 kg   SpO2 100%   BMI 33.00 kg/m   Physical Exam Vitals and nursing note reviewed.  Constitutional:      General: He is not in acute distress.    Appearance: He is well-developed. He is not diaphoretic.     Comments: Nontoxic appearing and in NAD  HENT:     Head: Normocephalic and atraumatic.  Eyes:     General: No scleral icterus.    Conjunctiva/sclera: Conjunctivae normal.  Cardiovascular:     Rate and Rhythm: Normal rate and regular rhythm.  Pulses: Normal pulses.  Pulmonary:     Effort: Pulmonary effort is normal. No respiratory distress.     Comments: Respirations even and unlabored Abdominal:     General: There is no distension.  Musculoskeletal:        General: Normal range of motion.     Cervical back: Normal range of motion.  Skin:    General: Skin is warm and dry.     Coloration: Skin is not pale.     Findings: No erythema or rash.  Neurological:     Mental Status: He is alert and oriented to person, place, and time.  Psychiatric:        Mood and Affect: Mood is anxious.        Behavior: Behavior is cooperative.     ED Results / Procedures / Treatments   Labs (all labs  ordered are listed, but only abnormal results are displayed) Labs Reviewed  COMPREHENSIVE METABOLIC PANEL - Abnormal; Notable for the following components:      Result Value   Potassium 3.1 (*)    Glucose, Bld 110 (*)    Total Protein 8.7 (*)    AST 119 (*)    ALT 67 (*)    Total Bilirubin 1.3 (*)    All other components within normal limits  ETHANOL - Abnormal; Notable for the following components:   Alcohol, Ethyl (B) 230 (*)    All other components within normal limits  RESP PANEL BY RT-PCR (RSV, FLU A&B, COVID)  RVPGX2  CBC WITH DIFFERENTIAL/PLATELET    EKG None  Radiology No results found.  Procedures Procedures    Medications Ordered in ED Medications  pantoprazole (PROTONIX) injection 40 mg (40 mg Intravenous Given 02/14/23 2241)  famotidine (PEPCID) IVPB 20 mg premix (0 mg Intravenous Stopped 02/14/23 2341)  ondansetron (ZOFRAN) injection 4 mg (4 mg Intravenous Given 02/14/23 2240)  sodium chloride 0.9 % bolus 1,000 mL (0 mLs Intravenous Stopped 02/15/23 0032)  potassium chloride SA (KLOR-CON M) CR tablet 40 mEq (40 mEq Oral Given 02/15/23 0049)    ED Course/ Medical Decision Making/ A&P Clinical Course as of 02/15/23 0330  Sat Feb 14, 2023  2336 Alcohol today is 230.  He has mild transaminitis consistent with history of ongoing alcohol abuse.  Will give oral potassium for hypokalemia of 3.1.  Hemoglobin and hematocrit are stable, normal.  No tachycardia, hypotension.  Doubt acute/emergent blood loss.  More likely that streaks of blood are related to Mallory-Weiss tears from forceful emesis.  He has been managed in the ED with Protonix as well as Pepcid. [KH]  Sun Feb 15, 2023  0114 Tolerating PO fluids in the ED without vomiting. [KH]    Clinical Course User Index [KH] Antony Madura, PA-C                             Medical Decision Making Amount and/or Complexity of Data Reviewed Labs: ordered.  Risk OTC drugs. Prescription drug management.   This patient  presents to the ED for concern of vomiting and ETOH abuse, this involves an extensive number of treatment options, and is a complaint that carries with it a high risk of complications and morbidity.  The differential diagnosis includes esophageal varices vs mallory weiss tear vs gastritis/PUD vs acute withdrawal vs intoxication   Co morbidities that complicate the patient evaluation  ETOH abuse   Additional history obtained:  External records from outside source obtained  and reviewed including psychiatric evaluation from 02/09/23   Lab Tests:  I Ordered, and personally interpreted labs.  The pertinent results include:  K 3.1, AST 119, ALT 67, Ethanol 230.   Cardiac Monitoring:  The patient was maintained on a cardiac monitor.  I personally viewed and interpreted the cardiac monitored which showed an underlying rhythm of: NSR   Medicines ordered and prescription drug management:  I ordered medication including Zofran for nausea and Protonix and Pepcid for N/V/UGI protection. Given oral potassium for hypokalemia.  Reevaluation of the patient after these medicines showed that the patient improved I have reviewed the patients home medicines and have made adjustments as needed   Test Considered:  Lipase    Problem List / ED Course:  As above   Reevaluation:  After the interventions noted above, I reevaluated the patient and found that they have :improved   Social Determinants of Health:  ETOH abuse   Dispostion:  After consideration of the diagnostic results and the patients response to treatment, I feel that the patent would benefit from follow up at [facility] for assistance with alcohol abuse and detox. Suspect symptoms to be secondary to mallory weiss tear vs gastritis/PUD. Though evaluation today is w/o signs of acute/emergent blood loss or active exsanguination.  He remains hemodynamically stable, tolerating PO. Discharged back to [facility] in stable  condition.         Final Clinical Impression(s) / ED Diagnoses Final diagnoses:  Alcoholic intoxication with complication (HCC)  Nausea and vomiting, unspecified vomiting type  Hypokalemia    Rx / DC Orders ED Discharge Orders     None         Antony Madura, PA-C 02/15/23 0349    Nira Conn, MD 02/15/23 [phone]

## 2023-02-14 NOTE — ED Notes (Signed)
Safe transport called 

## 2023-02-14 NOTE — Progress Notes (Signed)
   02/14/23 1912  [place] Triage Screening (Walk-ins at [facility] only)  How Did You Hear About [place]? Other (Comment) ([id number])  What Is the Reason for Your Visit/Call Today? Pt has a history of depression and says he has been abusing alcohol daily. He states he broke up with his fiancee 5 days ago and she was also abusing alcohol. He says he stopped drinking but became ill and therefore resumed to avoid a alcohol withdrawal seizure. Pt says he has been vomiting blood and currrently feels dizzy. He denies current suicidal ideation, homicidal ideation, or psychotic symptoms. He says he feels depressed and cannot stop crying.  How Long Has This Been Causing You Problems? 1-6 months  Have You Recently Had Any Thoughts About Hurting Yourself? No  Are You Planning to Commit Suicide/Harm Yourself At This time? No  Have you Recently Had Thoughts About [place]? No  Are You Planning To Harm Someone At This Time? No  Are you currently experiencing any auditory, visual or other hallucinations? No  Have You Used Any Alcohol or Drugs in the Past 24 Hours? Yes  How long ago did you use Drugs or Alcohol? Pt reports drinking 1 shot of liquor at 3 pm  What Did You Use and How Much? Pt reports drinking 1 shot of liquor at 3 pm  Do you have any current medical co-morbidities that require immediate attention? No  Clinician description of patient physical appearance/behavior: Pt is casually dressed and well-groomed. He is alert and oriented x4. Pt speaks in a clear tone, at moderate volume and normal pace. Motor behavior appears normal. Eye contact is good and he is tearful. Pt's mood is depressed and affect is congruent with mood. Thought process is coherent and relevant. There is no indication he is currently responding to internal stimuli or experiencing delusional thought content. He is cooperative.  What Do You Feel Would Help You the Most Today? Alcohol or Drug Use Treatment;Treatment for Depression or other mood  problem  If access to [facility] was not available, would you have sought care in the Emergency Department? Yes  Determination of Need Emergent (2 hours)  Options For Referral Facility-Based Crisis;BH Urgent Care;Medication Management   Notified Leandro Reasoner, NP immediately of Pt's reported medical symptoms.

## 2023-02-15 ENCOUNTER — Other Ambulatory Visit (HOSPITAL_COMMUNITY)
Admission: EM | Admit: 2023-02-15 | Discharge: 2023-02-19 | Disposition: A | Discharge status: Home / Self Care | Payer: No Typology Code available for payment source | Attending: Psychiatry | Admitting: Psychiatry

## 2023-02-15 ENCOUNTER — Encounter (HOSPITAL_COMMUNITY): Payer: Self-pay | Admitting: Psychiatry

## 2023-02-15 DIAGNOSIS — F1020 Alcohol dependence, uncomplicated: Secondary | ICD-10-CM

## 2023-02-15 DIAGNOSIS — F332 Major depressive disorder, recurrent severe without psychotic features: Secondary | ICD-10-CM | POA: Insufficient documentation

## 2023-02-15 DIAGNOSIS — Z811 Family history of alcohol abuse and dependence: Secondary | ICD-10-CM | POA: Insufficient documentation

## 2023-02-15 DIAGNOSIS — Z818 Family history of other mental and behavioral disorders: Secondary | ICD-10-CM | POA: Insufficient documentation

## 2023-02-15 LAB — RESP PANEL BY RT-PCR (RSV, FLU A&B, COVID)  RVPGX2
Influenza A by PCR: NEGATIVE
Influenza B by PCR: NEGATIVE
Resp Syncytial Virus by PCR: NEGATIVE
SARS Coronavirus 2 by RT PCR: NEGATIVE

## 2023-02-15 MED ORDER — HYDROXYZINE HCL 25 MG PO TABS
25.0000 mg | ORAL_TABLET | Freq: Four times a day (QID) | ORAL | Status: AC | PRN
  Administered 2023-02-16 (×2): 25 mg via ORAL
  Filled 2023-02-15 (×2): qty 1

## 2023-02-15 MED ORDER — THIAMINE HCL 100 MG/ML IJ SOLN: 100.0000 mg | Freq: Once | INTRAMUSCULAR | Status: DC

## 2023-02-15 MED ORDER — LORAZEPAM 1 MG PO TABS
1.0000 mg | ORAL_TABLET | Freq: Four times a day (QID) | ORAL | Status: AC | PRN
  Filled 2023-02-15 (×2): qty 1

## 2023-02-15 MED ORDER — LORAZEPAM 1 MG PO TABS
1.0000 mg | ORAL_TABLET | Freq: Four times a day (QID) | ORAL | Status: AC
  Administered 2023-02-15 (×4): 1 mg via ORAL
  Filled 2023-02-15 (×3): qty 1

## 2023-02-15 MED ORDER — THIAMINE MONONITRATE 100 MG PO TABS
100.0000 mg | ORAL_TABLET | Freq: Every day | ORAL | Status: DC
  Administered 2023-02-16 – 2023-02-19 (×4): 100 mg via ORAL
  Filled 2023-02-15 (×4): qty 1

## 2023-02-15 MED ORDER — LORAZEPAM 1 MG PO TABS
1.0000 mg | ORAL_TABLET | Freq: Every day | ORAL | Status: AC
  Administered 2023-02-18: 1 mg via ORAL
  Filled 2023-02-15: qty 1

## 2023-02-15 MED ORDER — ADULT MULTIVITAMIN W/MINERALS CH
1.0000 | ORAL_TABLET | Freq: Every day | ORAL | Status: DC
  Administered 2023-02-16 – 2023-02-19 (×4): 1 via ORAL
  Filled 2023-02-15 (×5): qty 1

## 2023-02-15 MED ORDER — LORAZEPAM 1 MG PO TABS
1.0000 mg | ORAL_TABLET | Freq: Three times a day (TID) | ORAL | Status: AC
  Administered 2023-02-16 (×3): 1 mg via ORAL
  Filled 2023-02-15 (×3): qty 1

## 2023-02-15 MED ORDER — POTASSIUM CHLORIDE CRYS ER 20 MEQ PO TBCR
40.0000 meq | EXTENDED_RELEASE_TABLET | Freq: Once | ORAL | Status: AC
  Administered 2023-02-15: 40 meq via ORAL
  Filled 2023-02-15: qty 2

## 2023-02-15 MED ORDER — PRAZOSIN HCL 1 MG PO CAPS
1.0000 mg | ORAL_CAPSULE | Freq: Every day | ORAL | Status: DC
  Administered 2023-02-15 – 2023-02-18 (×4): 1 mg via ORAL
  Filled 2023-02-15 (×4): qty 1

## 2023-02-15 MED ORDER — BUPROPION HCL ER (XL) 150 MG PO TB24
150.0000 mg | ORAL_TABLET | ORAL | Status: DC
  Administered 2023-02-15: 150 mg via ORAL
  Filled 2023-02-15: qty 1

## 2023-02-15 MED ORDER — LORAZEPAM 1 MG PO TABS
1.0000 mg | ORAL_TABLET | Freq: Two times a day (BID) | ORAL | Status: AC
  Administered 2023-02-17 (×2): 1 mg via ORAL
  Filled 2023-02-15 (×2): qty 1

## 2023-02-15 MED ORDER — TRAZODONE HCL 50 MG PO TABS
50.0000 mg | ORAL_TABLET | Freq: Every evening | ORAL | Status: DC | PRN
  Administered 2023-02-16 – 2023-02-18 (×3): 50 mg via ORAL
  Filled 2023-02-15 (×3): qty 1

## 2023-02-15 MED ORDER — ACETAMINOPHEN 325 MG PO TABS
650.0000 mg | ORAL_TABLET | Freq: Four times a day (QID) | ORAL | Status: DC | PRN
  Administered 2023-02-17: 650 mg via ORAL
  Filled 2023-02-15: qty 2

## 2023-02-15 MED ORDER — ONDANSETRON 4 MG PO TBDP
4.0000 mg | ORAL_TABLET | Freq: Four times a day (QID) | ORAL | Status: AC | PRN
  Administered 2023-02-15 (×3): 4 mg via ORAL
  Filled 2023-02-15 (×2): qty 1

## 2023-02-15 MED ORDER — LOPERAMIDE HCL 2 MG PO CAPS
2.0000 mg | ORAL_CAPSULE | ORAL | Status: AC | PRN
  Administered 2023-02-15: 4 mg via ORAL
  Administered 2023-02-16 (×2): 2 mg via ORAL
  Filled 2023-02-15: qty 2
  Filled 2023-02-15 (×2): qty 1

## 2023-02-15 MED ORDER — MAGNESIUM HYDROXIDE 400 MG/5ML PO SUSP: 30.0000 mL | Freq: Every day | ORAL | Status: DC | PRN

## 2023-02-15 MED ORDER — ALUM & MAG HYDROXIDE-SIMETH 200-200-20 MG/5ML PO SUSP: 30.0000 mL | ORAL | Status: DC | PRN

## 2023-02-15 NOTE — ED Notes (Signed)
Patient complained of diarrhea and ongoing nausea. Patient given imodium and zofran.  He continues to have limited appetite and tremors.  Ciwa score is a 10.  Patient is calm and pleasant.  Tolerating ativan taper.  Will monitor and treat withdrawal symptoms accordingly.
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
## 2023-02-15 NOTE — ED Notes (Signed)
Pt in room , resting on bed. A/O, states" I am feeling better, not shaking as much and was able to eat a little". "I do not feel as nauseous" .  Denies SI/HI/AVH. No noted distress. Will continue to monitor for safety
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
## 2023-02-15 NOTE — ED Notes (Signed)
Pt c/o nausea and vomiting.  Medicated with PRN zofran.

## 2023-02-15 NOTE — ED Notes (Signed)
Patient attended a recovery group after dinner run by RN.  He was attentive and participatory.

## 2023-02-15 NOTE — ED Notes (Signed)
Patient is currently sitting in bed calmly, he stated he is unable to sleep, no distress noted, patient did not ask for any additional medication, will continue to monitor patient for safety

## 2023-02-15 NOTE — ED Notes (Signed)
Patient reports continued nausea and vomiting x 2 this morning.  He has tremors and tongue vesiculation.  Patient CIWA = 11.  Patient given 1mg  ativan for withdrawal and 4mg  zofran po for N & V.  Patient also given gatorade and encouraged to sip it slowly.  Will monitor and provide safety and support as needed.

## 2023-02-15 NOTE — ED Notes (Signed)
Safe transport called for transportation to [facility].

## 2023-02-15 NOTE — ED Notes (Signed)
Pt is A & O x 4. Pt is oriented to the unit and provided with meal. Medication has been administered to him and pt is lying on his bed. Pt denies SI/HI/AVH. Will continue to monitor for safety. 

## 2023-02-16 DIAGNOSIS — F1020 Alcohol dependence, uncomplicated: Secondary | ICD-10-CM | POA: Diagnosis not present

## 2023-02-16 LAB — POTASSIUM: Potassium: 3.5 mmol/L (ref 3.5–5.1)

## 2023-02-16 MED ORDER — NICOTINE 14 MG/24HR TD PT24
14.0000 mg | MEDICATED_PATCH | Freq: Every day | TRANSDERMAL | Status: DC
  Administered 2023-02-16 – 2023-02-19 (×4): 14 mg via TRANSDERMAL
  Filled 2023-02-16 (×4): qty 1

## 2023-02-16 NOTE — ED Notes (Signed)
Patient has reported that he is no longer nauseous or vomiting however he continues to have diarrhea.  Patient given 4mg  imodium PO now to help relieve symptoms.

## 2023-02-16 NOTE — ED Notes (Signed)
Pt awake, states " I am ok, just bored. I do not know if I can stand 3 more days here."  This nurse provided supportive listening. Offered PRN med for anxiety, pt declines. States " I do not want to take anymore meds right now".  No noted distress. Will continue to monitor for sfaety

## 2023-02-16 NOTE — ED Notes (Signed)
Patient came to nurse station asking to be discharged. He states he no longer wants to be here. I advised patient at this time the providers are changing shift, I will relay the information to the nurses coming in at 7 am to relay to the first shift providers.

## 2023-02-16 NOTE — ED Notes (Signed)
Pt awake in room, No noted distress Will continue to monitor for safety

## 2023-02-16 NOTE — ED Notes (Signed)
Pt is frustrated he thought he was leaving tomorrow but found out he wasn't. He thinks he's been here at least 3-4 days. Only has been here for 2 and now he's getting upset and wants to leave.

## 2023-02-16 NOTE — ED Notes (Addendum)
Patient observed/assessed at doorway to patient's room. Patient alert and oriented x 4. Affect is bright and patient states that he "feeling better after shower." Patient denies pain and anxiety. He denies A/V/H. He denies having any thoughts/plan of self harm and harm towards others. Fluid and snack offered. Patient states that appetite has been good throughout the day. Last BM was today 02/16/23 without issue. Verbalizes no further complaints at this time. Will continue to monitor and support.

## 2023-02-16 NOTE — ED Notes (Signed)
Patient initially stated that he wanted to be discharged however after speaking with patient and encouraging him to stay he has agreed to one more day.  Patient has limited knowledge of chemical dependence issues.  He states he just wants to go home.  Patient is calm and pleasant however continues to have mild tremors.  Will monitor and provide as safe environment for him.  Denies avh shi or plan.

## 2023-02-16 NOTE — Discharge Planning (Signed)
CDIOP has been arranged for 02/23/2023 at 1:00pm with Steele Sizer. Patient has been informed and information has been updated in AVS.   Lucius Conn, [facility] Ph: [phone]

## 2023-02-16 NOTE — Discharge Instructions (Signed)
[facility] [address] [place], [place], [address] [phone] phone  New Patient Assessment/Therapy Walk-Ins:  Monday and Wednesday: 8 am until slots are full. Every 1st and 2nd Fridays of the month: 1 pm - 5 pm.  NO ASSESSMENT/THERAPY WALK-INS ON TUESDAYS OR THURSDAYS  New Patient Assessment/Medication Management Walk-Ins:  Monday - Friday:  8 am - 11 am.  For all walk-ins, we ask that you arrive by 7:30 am because patients will be seen in the order of arrival.  Availability is limited; therefore, you may not be seen on the same day that you walk-in.  Our goal is to serve and meet the needs of our community to the best of our ability.  SUBSTANCE USE TREATMENT for Medicaid and State Funded/IPRS  [facility] [address] [place], [place], [address] [phone] phone NOTE: [facility] is no longer offering IOP services.  Serves those who are low-income or have no insurance.  [facility] [address], [place], [place], [address] [phone] phone [phone] fax NOTE: Does have Substance Abuse-Intensive Outpatient Program (SAIOP) as well as transitional housing if eligible.  [facility] [address] [place], [place], [address] [phone] phone [phone] fax  [facility] [address] [place], [place], [address] [phone] phone [phone] fax  HALFWAY HOUSES:  [facility] [phone]  [facility] www.oxfordvacancies.com  12 STEP PROGRAMS:  Alcoholics Anonymous of [place] https://aagreensboronc.com/meeting  Narcotics Anonymous of [place] https://greensborona.org/meetings/  Al-Anon of [place], [place] www.greens[place]alanon.org/find-meetings.html  Nar-Anon https://nar-anon.org/find-a-meetin  List of Residential placements:   [facility]: [phone]  [facility]: [place], [place] [phone]: Male and male facility; 30-day program:  (uninsured and Medicaid such as [facility], [facility], [facility], partners)  [facility]: [phone]; men and women's facility; 28 days; Can have Medicaid tailored plan ([facility] or [facility])  [facility]: [phone] Angie or [place]; 28 day program; must be fully detox; tailored Medicaid or no insurance  [facility] in [place], [place]; [phone]; 28 day all males program; no insurance accepted  [facility] in [place]: [place] [phone] (no insurance or Medicaid only); 90 days; outpatient services but provide housing in apartments [place]  [facility] Admission: [phone]: Patient must complete phone screening for placement: [place], [place]; 6 month program; uninsured, Medicaid, and [facility] insurance.   [facility]: no insurance required; [phone]  [facility]: [phone]; Intake: Robert; Must fill out application online; Victor Delay [phone] x [phone]  [facility] in [place], [place]: [phone]; Admissions Coordinators Mr. Dennis or David Gibson; 90 day program.  [facility]: [place], [place] [phone]; Co-Ed 9 month to a year program; Online application; Men entry fee is $500 (6-12months);  [facility]: [address] [place], [place] [address]; no fee or insurance required; minimum of 2 years; Highly structured; work based; Intake Coordinator is Chris [phone]  [facility] in [place], [place]: [phone]; Fax number is [phone]; website: www.[facility].org; Requires 3-6 page autobiography; 2 year program (18 months and then 6month transitional housing); Admission fee is $300; no insurance needed; work program  [facility] in [place], : Front Desk Staff: Reeci [phone]: They have a Men's Regenerations Program 6-9months. Free program; There is an initial $300 fee however, they are willing to work with patients regarding that. Application is  online.  [facility]: Admissions [phone] Benjamin Cox ext [phone]; Any 7-90 day program is out of pocket; 12 month program is free of charge; there is   a $275 entry fee; Patient is responsible for own transportation  Patient is instructed prior to discharge to:  Take all medications as prescribed by his/her mental healthcare provider. Report any adverse effects and or reactions from the medicines to his/her outpatient provider promptly. Keep all scheduled appointments, to ensure that you are getting refills on time and to avoid any interruption in your medication.  If you are unable to keep an appointment call to reschedule.  Be sure to follow-up with resources and follow-up appointments provided.  Patient has been instructed & cautioned: To not engage in alcohol and or illegal drug use while on prescription medicines. In the event of worsening symptoms, patient is instructed to call the crisis hotline, 911 and or go to the nearest ED for appropriate evaluation and treatment of symptoms. To follow-up with his/her primary care provider for your other medical issues, concerns and or health care needs.    
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
## 2023-02-16 NOTE — ED Notes (Signed)
Patient alert and anxious. Complains of anxiety pertaining to wanting to leave treatment now but is unsure when he will be. Supportive listening, and distraction techniques unsuccessful and dose of PRN Vistaril for anxiety administered per orders for anxiety level 7/10. Will continue to monitor/support.
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
## 2023-02-16 NOTE — ED Notes (Signed)
Pt awake at nurses station. Anxious and can not sleep. Offered Vistaril again to help with anxiety. He agreed, PRN vistaril given per order. Will continue to monitor for safety

## 2023-02-17 DIAGNOSIS — F1020 Alcohol dependence, uncomplicated: Secondary | ICD-10-CM | POA: Diagnosis not present

## 2023-02-17 MED ORDER — SERTRALINE HCL 50 MG PO TABS
50.0000 mg | ORAL_TABLET | Freq: Every day | ORAL | Status: DC
  Administered 2023-02-17 – 2023-02-19 (×3): 50 mg via ORAL
  Filled 2023-02-17 (×3): qty 1

## 2023-02-17 NOTE — ED Notes (Signed)
Patient has been calm on unit and mostly isolative to his room.  Patient is without withdrawal or complaint at this time.  He is pleasant on approach and without distress.  Makes needs known.  Will monitor and provide a safe environment.

## 2023-02-17 NOTE — ED Notes (Signed)
Patient observed/assessed in room in bed appearing in no immediate distress resting peacefully. Q15 minute checks continued by MHT and nursing staff. Will continue to monitor and support. 

## 2023-02-17 NOTE — ED Notes (Signed)
Patient was provided with breakfast 

## 2023-02-17 NOTE — ED Notes (Signed)
Patient was provided with dinner
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
## 2023-02-17 NOTE — ED Notes (Signed)
Patient asleep in bed without issue or complaint.  No evidence of withdrawal at this time.  Will monitor and provide supportive environment.

## 2023-02-17 NOTE — ED Notes (Signed)
Pt requested acetaminophen for headache pain of 5/10. Writer administered 650mg  of acetaminophen to pt . Pain will be assessed again.

## 2023-02-17 NOTE — ED Notes (Signed)
Patient was provided with lunch

## 2023-02-17 NOTE — ED Notes (Signed)
Pt is in the bed resting. Respirations are even and unlabored. No acute distress noted. Will continue to monitor for safety  

## 2023-02-18 DIAGNOSIS — F1020 Alcohol dependence, uncomplicated: Secondary | ICD-10-CM | POA: Diagnosis not present

## 2023-02-18 NOTE — ED Notes (Signed)
Patient A&Ox4. Denies intent to harm self/others when asked. Denies A/VH. Patient denies any physical complaints when asked. No acute distress noted. Pt states, "I feel a lot better now that I was able to get some rest last night. I think I have to have a snack at night to go to sleep".  Routine safety checks conducted according to facility protocol. Encouraged patient to notify staff if thoughts of harm toward self or others arise. Patient verbalize understanding and agreement. Will continue to monitor for safety.

## 2023-02-18 NOTE — ED Notes (Signed)
Pt sitting in dayroom interacting with peers. No acute distress noted. No concerns voiced. Informed pt to notify staff with any needs or assistance. Pt verbalized understanding or agreement. Will continue to monitor for safety. 

## 2023-02-18 NOTE — ED Notes (Signed)
Patient is sleeping. Respirations equal and unlabored, skin warm and dry, NAD. No change in assessment or acuity. Routine safety checks conducted according to facility protocol. Will continue to monitor for safety.   
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
## 2023-02-19 DIAGNOSIS — F1020 Alcohol dependence, uncomplicated: Secondary | ICD-10-CM | POA: Diagnosis not present

## 2023-02-19 MED ORDER — NICOTINE 14 MG/24HR TD PT24: 14.0000 mg | MEDICATED_PATCH | Freq: Every day | TRANSDERMAL | 0 refills | Status: DC

## 2023-02-19 MED ORDER — SERTRALINE HCL 50 MG PO TABS: 50.0000 mg | ORAL_TABLET | Freq: Every day | ORAL | 0 refills | Status: DC

## 2023-02-19 NOTE — ED Notes (Signed)
Patient resting quietly in bed with eyes closed, Respirations equal and unlabored, skin warm and dry, NAD. Routine safety checks conducted according to facility protocol. Will continue to monitor for safety 

## 2023-02-19 NOTE — ED Provider Notes (Signed)
FBC/OBS ASAP Discharge Summary  Date and Time: 02/19/2023 9:41 PM  Name: Mitchell Skinner  MRN:  [id number]   Discharge Diagnoses:  Final diagnoses:  Alcohol use disorder, severe, dependence ([place])  Severe episode of recurrent major depressive disorder, without psychotic features ([place])    Subjective: Mitchell Skinner is a 32 year old male with a psychiatric history of MDD and who reports long history of alcohol abuse and extensive family hx of alcohol abuse (mother, father, and brother are alcoholic); he requests alcohol detox, for which he was admitted to [facility].  Stay Summary: The patient was evaluated each day by a clinical provider to ascertain response to treatment. Improvement was noted by the patient's report of decreasing symptoms, improved sleep and appetite, affect, medication tolerance, behavior, and participation in unit programming.  Patient was asked each day to complete a self inventory noting mood, mental status, pain, new symptoms, anxiety and concerns.   Patient responded well to medication and being in a therapeutic and supportive environment. Positive and appropriate behavior was noted and the patient was motivated for recovery. The patient worked closely with the treatment team and case manager to develop a discharge plan with appropriate goals. Coping skills, problem solving as well as relaxation therapies were also part of the unit programming.   By the day of discharge patient was in much improved condition than upon admission.  Symptoms were reported as significantly decreased or resolved completely. The patient denied SI/HI and voiced no AVH. The patient was motivated to continue taking medication with a goal of continued improvement in mental health.    Total Time spent with patient: 20 minutes   Past Psychiatric History: History of depression, anxiety, PTSD, and alcohol use disorder   Past Medical History: History of asthma   Family History: No reported history   Family  Psychiatric  History: Mother has a history of bipolar. Patient reports long history of alcohol abuse and extensive family hx of alcohol abuse (mother, father, and brother are alcoholic).    Social History: Patient is single. Alcohol use. Lives alone. Employed.   Tobacco Cessation:  A prescription for an FDA-approved tobacco cessation medication provided at discharge  Current Medications:  No current facility-administered medications for this encounter.   Current Outpatient Medications  Medication Sig Dispense Refill   albuterol (VENTOLIN HFA) 108 (90 Base) MCG/ACT inhaler Inhale 2 puffs into the lungs every 6 (six) hours as needed for wheezing or shortness of breath.     cetirizine (ZYRTEC) 10 MG tablet Take 10 mg by mouth daily as needed (for seasonal allergies- when not taking Claritin).     ibuprofen (ADVIL) 600 MG tablet Take 1 tablet (600 mg total) by mouth every 6 (six) hours as needed. (Patient taking differently: Take 600 mg by mouth every 6 (six) hours as needed for mild pain or headache.) 30 tablet 0   loratadine (CLARITIN) 10 MG tablet Take 10 mg by mouth daily as needed (for seasonal allergies- when not taking Zyrtec).     nicotine (NICODERM CQ - DOSED IN MG/24 HOURS) 14 mg/24hr patch Place 1 patch (14 mg total) onto the skin daily. 28 patch 0   prazosin (MINIPRESS) 1 MG capsule Take 1 capsule (1 mg total) by mouth at bedtime. (Patient not taking: Reported on 02/14/2023) 30 capsule 1   sertraline (ZOLOFT) 50 MG tablet Take 1 tablet (50 mg total) by mouth daily. 30 tablet 0    PTA Medications:  PTA Medications  Medication Sig   cetirizine (  ZYRTEC) 10 MG tablet Take 10 mg by mouth daily as needed (for seasonal allergies- when not taking Claritin).   loratadine (CLARITIN) 10 MG tablet Take 10 mg by mouth daily as needed (for seasonal allergies- when not taking Zyrtec).   ibuprofen (ADVIL) 600 MG tablet Take 1 tablet (600 mg total) by mouth every 6 (six) hours as needed. (Patient  taking differently: Take 600 mg by mouth every 6 (six) hours as needed for mild pain or headache.)   prazosin (MINIPRESS) 1 MG capsule Take 1 capsule (1 mg total) by mouth at bedtime. (Patient not taking: Reported on 02/14/2023)   albuterol (VENTOLIN HFA) 108 (90 Base) MCG/ACT inhaler Inhale 2 puffs into the lungs every 6 (six) hours as needed for wheezing or shortness of breath.   nicotine (NICODERM CQ - DOSED IN MG/24 HOURS) 14 mg/24hr patch Place 1 patch (14 mg total) onto the skin daily.   sertraline (ZOLOFT) 50 MG tablet Take 1 tablet (50 mg total) by mouth daily.   Facility Ordered Medications  Medication   [COMPLETED] sodium chloride 0.9 % bolus 1,000 mL   [COMPLETED] potassium chloride SA (KLOR-CON M) CR tablet 40 mEq   [EXPIRED] LORazepam (ATIVAN) tablet 1 mg   [EXPIRED] hydrOXYzine (ATARAX) tablet 25 mg   [EXPIRED] loperamide (IMODIUM) capsule 2-4 mg   [EXPIRED] ondansetron (ZOFRAN-ODT) disintegrating tablet 4 mg   [COMPLETED] LORazepam (ATIVAN) tablet 1 mg   Followed by   [COMPLETED] LORazepam (ATIVAN) tablet 1 mg   Followed by   [COMPLETED] LORazepam (ATIVAN) tablet 1 mg   Followed by   [COMPLETED] LORazepam (ATIVAN) tablet 1 mg       02/19/2023    8:01 AM 02/17/2023    2:27 PM 02/15/2023    9:39 AM  Depression screen PHQ 2/9  Decreased Interest 0 0 2  Down, Depressed, Hopeless 0 0 2  PHQ - 2 Score 0 0 4  Altered sleeping 0 0 2  Tired, decreased energy 0 0 3  Change in appetite 0 0 2  Feeling bad or failure about yourself  0 0 2  Trouble concentrating 0 0 2  Moving slowly or fidgety/restless 0 0 0  Suicidal thoughts 0 0 0  PHQ-9 Score 0 0 15  Difficult doing work/chores Not difficult at all Somewhat difficult     Flowsheet Row ED from 02/15/2023 in [facility] Most recent reading at 02/15/2023  8:15 AM ED from 02/14/2023 in [facility] Most recent reading at 02/14/2023 10:13 PM ED from 02/14/2023  in [facility] Most recent reading at 02/14/2023  7:41 PM  C-SSRS RISK CATEGORY No Risk No Risk No Risk       Musculoskeletal  Strength & Muscle Tone: within normal limits Gait & Station: normal Patient leans: N/A  Psychiatric Specialty Exam  Presentation  General Appearance:  Appropriate for Environment; Casual  Eye Contact: Good  Speech: Clear and Coherent; Normal Rate  Speech Volume: Normal  Handedness: Right   Mood and Affect  Mood: Euthymic  Affect: Congruent   Thought Process  Thought Processes: Coherent; Goal Directed; Linear  Descriptions of Associations:Intact  Orientation:Full (Time, Place and Person)  Thought Content:WDL; Logical  Diagnosis of Schizophrenia or Schizoaffective disorder in past: No    Hallucinations:Hallucinations: None  Ideas of Reference:None  Suicidal Thoughts:Suicidal Thoughts: No  Homicidal Thoughts:Homicidal Thoughts: No   Sensorium  Memory: Immediate Good; Recent Good  Judgment: Good  Insight: Fair  Executive Functions  Concentration: Good  Attention Span: Good  Recall: Roel Cluck of Knowledge: Fair  Language: Fair   Psychomotor Activity  Psychomotor Activity:Psychomotor Activity: Normal   Assets  Assets: Armed forces logistics/support/administrative officer; Desire for Improvement; Housing; Leisure Time; Physical Health; Social Support; Vocational/Educational   Sleep  Sleep:Sleep: Fair   No data recorded   Physical Exam  Physical Exam HENT:     Nose: Nose normal.  Cardiovascular:     Rate and Rhythm: Normal rate.  Pulmonary:     Effort: Pulmonary effort is normal.  Musculoskeletal:        General: Normal range of motion.     Cervical back: Normal range of motion.  Neurological:     Mental Status: He is alert and oriented to person, place, and time.    Review of Systems  Constitutional:  Negative for chills and malaise/fatigue.  Respiratory:  Negative for cough.    Gastrointestinal:  Negative for abdominal pain, constipation, nausea and vomiting.  Musculoskeletal:  Negative for myalgias.  Neurological:  Negative for weakness and headaches.   Blood pressure 136/85, pulse 100, temperature 97.7 F (36.5 C), temperature source Oral, resp. rate 18, SpO2 100 %. There is no height or weight on file to calculate BMI.  Demographic Factors:  Male, Low socioeconomic status, and Living alone  Loss Factors: Loss of significant relationship and Financial problems/change in socioeconomic status  Historical Factors: Prior suicide attempts and Impulsivity  Risk Reduction Factors:   Sense of responsibility to family, Employed, Positive social support, Positive therapeutic relationship, and Positive coping skills or problem solving skills  Continued Clinical Symptoms:  Depression:   Comorbid alcohol abuse/dependence Alcohol/Substance Abuse/Dependencies Previous Psychiatric Diagnoses and Treatments  Cognitive Features That Contribute To Risk:  None    Suicide Risk:  Mild: There are no identifiable plans, no associated intent, mild dysphoria and related symptoms, good self-control (both objective and subjective assessment), few other risk factors, and identifiable protective factors, including available and accessible social support.  Plan Of Care/Follow-up recommendations: Follow-up recommendations:  Activity:  Normal, as tolerated Diet:  Per PCP recommendation  Patient is instructed prior to discharge to: Take all medications as prescribed by his mental healthcare provider. Report any adverse effects and/or reactions from the medicines to his outpatient provider promptly. Patient has been instructed & cautioned: To not engage in alcohol and or illegal drug use while on prescription medicines.  In the event of worsening symptoms, patient is instructed to call the crisis hotline at 988, 911 and or go to the nearest ED for appropriate evaluation and treatment  of symptoms. To follow-up with his primary care provider for your other medical issues, concerns and or health care needs.   Disposition: Home  Rosezetta Schlatter, MD 02/19/2023, 9:41 PM

## 2023-02-19 NOTE — ED Notes (Signed)
Patient A&Ox4. Denies intent to harm self/others when asked. Denies A/VH. Patient denies any physical complaints when asked. No acute distress noted. Support and encouragement provided. Routine safety checks conducted according to facility protocol. Encouraged patient to notify staff if thoughts of harm toward self or others arise. Patient verbalize understanding and agreement. Will continue to monitor for safety.    

## 2023-02-19 NOTE — ED Notes (Signed)
Patient is alert and oriented x 3. Observed attending and participating in [place]. Patient denies S & S of withdrawal. Patient denies SI/Hi and AVH. Denies any physical complaints when asked. No acute distress noted. Support and encouragement provided. Routine safety checks conducted according to facility protocol. Encouraged patient to notify staff if thoughts of harm toward self or others arise. Patient verbalize understanding and agreement. Will continue to monitor for safety.

## 2023-02-23 ENCOUNTER — Ambulatory Visit (INDEPENDENT_AMBULATORY_CARE_PROVIDER_SITE_OTHER): Payer: No Typology Code available for payment source | Admitting: Licensed Clinical Social Worker

## 2023-02-23 DIAGNOSIS — F1010 Alcohol abuse, uncomplicated: Secondary | ICD-10-CM

## 2023-02-23 DIAGNOSIS — F4310 Post-traumatic stress disorder, unspecified: Secondary | ICD-10-CM

## 2023-02-23 DIAGNOSIS — F331 Major depressive disorder, recurrent, moderate: Secondary | ICD-10-CM
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
## 2023-03-20 ENCOUNTER — Telehealth (HOSPITAL_BASED_OUTPATIENT_CLINIC_OR_DEPARTMENT_OTHER): Payer: No Typology Code available for payment source | Admitting: Psychiatry

## 2023-03-20 ENCOUNTER — Encounter (HOSPITAL_COMMUNITY): Payer: Self-pay | Admitting: Psychiatry

## 2023-03-20 VITALS — Wt 230.0 lb

## 2023-03-20 DIAGNOSIS — F1010 Alcohol abuse, uncomplicated: Secondary | ICD-10-CM

## 2023-03-20 DIAGNOSIS — F331 Major depressive disorder, recurrent, moderate: Secondary | ICD-10-CM

## 2023-03-20 DIAGNOSIS — T1490XA Injury, unspecified, initial encounter: Secondary | ICD-10-CM

## 2023-03-20 MED ORDER — SERTRALINE HCL 50 MG PO TABS: 50.0000 mg | ORAL_TABLET | Freq: Every day | ORAL | 1 refills | Status: DC
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
## 2023-04-13 ENCOUNTER — Telehealth (HOSPITAL_COMMUNITY): Payer: No Typology Code available for payment source | Admitting: Psychiatry

## 2023-05-20 ENCOUNTER — Telehealth (HOSPITAL_COMMUNITY): Payer: No Typology Code available for payment source | Admitting: Psychiatry

## 2023-05-22 ENCOUNTER — Telehealth (HOSPITAL_COMMUNITY): Payer: No Typology Code available for payment source | Admitting: Psychiatry

## 2023-05-22 ENCOUNTER — Encounter (HOSPITAL_COMMUNITY): Payer: Self-pay | Admitting: Psychiatry

## 2023-05-22 VITALS — Wt 206.0 lb

## 2023-05-22 DIAGNOSIS — F331 Major depressive disorder, recurrent, moderate: Secondary | ICD-10-CM | POA: Diagnosis not present

## 2023-05-22 DIAGNOSIS — T1490XA Injury, unspecified, initial encounter: Secondary | ICD-10-CM

## 2023-05-22 DIAGNOSIS — F1010 Alcohol abuse, uncomplicated: Secondary | ICD-10-CM | POA: Diagnosis not present

## 2023-05-22 MED ORDER — SERTRALINE HCL 25 MG PO TABS
75.0000 mg | ORAL_TABLET | Freq: Every day | ORAL | 2 refills | Status: AC
Start: 2023-05-22 — End: 2023-08-20
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
## 2023-08-19 ENCOUNTER — Telehealth (HOSPITAL_COMMUNITY): Payer: No Typology Code available for payment source | Admitting: Psychiatry

## 2023-12-23 ENCOUNTER — Other Ambulatory Visit (HOSPITAL_COMMUNITY): Payer: Self-pay | Admitting: Psychiatry

## 2023-12-23 DIAGNOSIS — T1490XA Injury, unspecified, initial encounter: Secondary | ICD-10-CM

## 2023-12-23 DIAGNOSIS — F331 Major depressive disorder, recurrent, moderate: Secondary | ICD-10-CM

## 2023-12-23 DIAGNOSIS — F1010 Alcohol abuse, uncomplicated: Secondary | ICD-10-CM

## 2024-01-15 ENCOUNTER — Telehealth (HOSPITAL_COMMUNITY): Payer: No Typology Code available for payment source | Admitting: Psychiatry

## 2024-01-15 ENCOUNTER — Encounter (HOSPITAL_COMMUNITY): Payer: Self-pay | Admitting: Psychiatry

## 2024-01-15 VITALS — Wt 220.0 lb

## 2024-01-15 DIAGNOSIS — F4310 Post-traumatic stress disorder, unspecified: Secondary | ICD-10-CM | POA: Insufficient documentation

## 2024-01-15 DIAGNOSIS — F1010 Alcohol abuse, uncomplicated: Secondary | ICD-10-CM | POA: Insufficient documentation

## 2024-01-15 DIAGNOSIS — F1011 Alcohol abuse, in remission: Secondary | ICD-10-CM

## 2024-01-15 DIAGNOSIS — F331 Major depressive disorder, recurrent, moderate: Secondary | ICD-10-CM

## 2024-01-15 MED ORDER — BUPROPION HCL ER (XL) 150 MG PO TB24
150.0000 mg | ORAL_TABLET | Freq: Every day | ORAL | 1 refills | Status: DC
Start: 2024-01-15 — End: 2024-02-12
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
## 2024-02-12 ENCOUNTER — Telehealth (HOSPITAL_COMMUNITY): Payer: No Typology Code available for payment source | Admitting: Psychiatry

## 2024-02-12 ENCOUNTER — Encounter (HOSPITAL_COMMUNITY): Payer: Self-pay | Admitting: Psychiatry

## 2024-02-12 VITALS — Wt 214.0 lb

## 2024-02-12 DIAGNOSIS — F1011 Alcohol abuse, in remission: Secondary | ICD-10-CM

## 2024-02-12 DIAGNOSIS — F331 Major depressive disorder, recurrent, moderate: Secondary | ICD-10-CM

## 2024-02-12 DIAGNOSIS — F4310 Post-traumatic stress disorder, unspecified: Secondary | ICD-10-CM

## 2024-02-12 MED ORDER — BUPROPION HCL ER (XL) 150 MG PO TB24: 150.0000 mg | ORAL_TABLET | Freq: Every day | ORAL | 1 refills | Status: DC
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
## 2024-04-24 ENCOUNTER — Other Ambulatory Visit (HOSPITAL_COMMUNITY): Payer: Self-pay | Admitting: Psychiatry

## 2024-04-24 DIAGNOSIS — F4310 Post-traumatic stress disorder, unspecified: Secondary | ICD-10-CM

## 2024-04-24 DIAGNOSIS — F1011 Alcohol abuse, in remission: Secondary | ICD-10-CM

## 2024-04-24 DIAGNOSIS — F331 Major depressive disorder, recurrent, moderate: Secondary | ICD-10-CM

## 2024-05-09 ENCOUNTER — Telehealth (HOSPITAL_BASED_OUTPATIENT_CLINIC_OR_DEPARTMENT_OTHER): Admitting: Psychiatry

## 2024-05-09 ENCOUNTER — Encounter (HOSPITAL_COMMUNITY): Payer: Self-pay | Admitting: Psychiatry

## 2024-05-09 DIAGNOSIS — F331 Major depressive disorder, recurrent, moderate: Secondary | ICD-10-CM | POA: Diagnosis not present

## 2024-05-09 DIAGNOSIS — F1011 Alcohol abuse, in remission: Secondary | ICD-10-CM | POA: Diagnosis not present

## 2024-05-09 DIAGNOSIS — F4310 Post-traumatic stress disorder, unspecified: Secondary | ICD-10-CM | POA: Diagnosis not present

## 2024-05-09 MED ORDER — BUPROPION HCL ER (XL) 150 MG PO TB24: 150.0000 mg | ORAL_TABLET | Freq: Every day | ORAL | 2 refills | Status: DC
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
## 2024-06-16 ENCOUNTER — Emergency Department (HOSPITAL_COMMUNITY)
Admission: EM | Admit: 2024-06-16 | Discharge: 2024-06-16 | Disposition: A | Discharge status: Home / Self Care | Source: Ambulatory Visit | Attending: Emergency Medicine | Admitting: Emergency Medicine

## 2024-06-16 ENCOUNTER — Encounter (HOSPITAL_COMMUNITY): Payer: Self-pay

## 2024-06-16 ENCOUNTER — Emergency Department (HOSPITAL_COMMUNITY)

## 2024-06-16 ENCOUNTER — Other Ambulatory Visit: Payer: Self-pay

## 2024-06-16 DIAGNOSIS — R112 Nausea with vomiting, unspecified: Secondary | ICD-10-CM | POA: Diagnosis present

## 2024-06-16 DIAGNOSIS — R1031 Right lower quadrant pain: Secondary | ICD-10-CM | POA: Diagnosis not present

## 2024-06-16 DIAGNOSIS — J45909 Unspecified asthma, uncomplicated: Secondary | ICD-10-CM | POA: Diagnosis not present

## 2024-06-16 LAB — COMPREHENSIVE METABOLIC PANEL WITH GFR
ALT: 30 U/L (ref 0–44)
AST: 25 U/L (ref 15–41)
Albumin: 4.2 g/dL (ref 3.5–5.0)
Alkaline Phosphatase: 50 U/L (ref 38–126)
Anion gap: 9 (ref 5–15)
BUN: 13 mg/dL (ref 6–20)
CO2: 24 mmol/L (ref 22–32)
Calcium: 9.1 mg/dL (ref 8.9–10.3)
Chloride: 105 mmol/L (ref 98–111)
Creatinine, Ser: 1.1 mg/dL (ref 0.61–1.24)
GFR, Estimated: >60 mL/min (ref >60)
Glucose, Bld: 95 mg/dL (ref 70–99)
Potassium: 4 mmol/L (ref 3.5–5.1)
Sodium: 138 mmol/L (ref 135–145)
Total Bilirubin: 0.8 mg/dL (ref 0.0–1.2)
Total Protein: 7.4 g/dL (ref 6.5–8.1)

## 2024-06-16 LAB — LIPASE, BLOOD: Lipase: 35 U/L (ref 11–51)

## 2024-06-16 LAB — CBC
HCT: 44.5 % (ref 39.0–52.0)
Hemoglobin: 14.4 g/dL (ref 13.0–17.0)
MCH: 27.6 pg (ref 26.0–34.0)
MCHC: 32.4 g/dL (ref 30.0–36.0)
MCV: 85.4 fL (ref 80.0–100.0)
Platelets: 243 K/uL (ref 150–400)
RBC: 5.21 MIL/uL (ref 4.22–5.81)
RDW: 13.1 % (ref 11.5–15.5)
WBC: 6.4 K/uL (ref 4.0–10.5)
nRBC: 0 % (ref 0.0–0.2)

## 2024-06-16 MED ORDER — ONDANSETRON 4 MG PO TBDP: 4.0000 mg | ORAL_TABLET | Freq: Three times a day (TID) | ORAL | 0 refills | Status: AC | PRN

## 2024-06-16 MED ORDER — IOHEXOL 350 MG/ML SOLN
75.0000 mL | Freq: Once | INTRAVENOUS | Status: AC | PRN
Start: 2024-06-16 — End: 2024-06-16
  Administered 2024-06-16: 75 mL via INTRAVENOUS

## 2024-06-16 MED ORDER — SODIUM CHLORIDE 0.9 % IV BOLUS
1000.0000 mL | Freq: Once | INTRAVENOUS | Status: AC
  Administered 2024-06-16: 1000 mL via INTRAVENOUS

## 2024-06-16 NOTE — Progress Notes (Signed)
 Patient ID: Mitchell Skinner is a 33 y.o. male.  Allergies[1]  Problem List[2]   Chief Complaint  Patient presents with  . Nausea  . Headache    S/s since yesterday, ibuprofen  is not working for the h/s - stated.  Nausea/vomiting noted today     History of Present Illness: History of Present Illness This is a patient with a history of alcohol use, now sober for over a year, presenting with vomiting, headache, and fatigue.  The patient experienced a mild headache last night while working on paperwork. He has been taking Ibuprofen  for his headaches, which provides temporary relief before the pain returns. He reports no blurry or double vision associated with his headaches. He also reports no neck stiffness, rashes, or body weakness. This was followed by an 2 episodes of NBNB emesis this morning. He reports no fevers or chills. He has not consumed any food or drink since the onset of his symptoms, except for water which he has been able to keep down.SABRA He does not wear glasses but used contact lenses 2 weeks ago. Denies eye pain. He rates the headache as a 2/10 currently and mostly is here due to vomiting.   He experienced some abdominal pain this morning, but it was predominantly right sided groin pain. He describes his back pain as cramping. He had an unusual bout of diarrhea yesterday, which he believes was caused by his Wellbutrin  medication. He reports no presence of blood in his stool. He reports no urinary symptoms such as burning or increased frequency.  SOCIAL HISTORY The patient has a history of alcohol use but has been sober for over a year.     Review of Systems: All others reviewed and negative except as listed above.  Objective: Vitals:   06/16/24 1120  BP: 118/86  BP Location: Left arm  Patient Position: Sitting  Pulse: 77  Resp: 18  Temp: 98.6 F (37 C)  TempSrc: Tympanic  SpO2: 99%  Weight: 99.8 kg (220 lb)     Physical Exam: Physical Exam Vitals and nursing note  reviewed.  Constitutional:      General: He is not in acute distress.    Appearance: Normal appearance. He is not toxic-appearing.  HENT:     Head: Normocephalic and atraumatic.     Nose: Nose normal.     Mouth/Throat:     Mouth: Mucous membranes are moist.   Eyes:     Extraocular Movements: Extraocular movements intact.     Conjunctiva/sclera: Conjunctivae normal.    Cardiovascular:     Rate and Rhythm: Normal rate and regular rhythm.  Pulmonary:     Effort: Pulmonary effort is normal.     Breath sounds: Normal breath sounds. No wheezing, rhonchi or rales.  Abdominal:     General: Abdomen is flat.     Tenderness: There is abdominal tenderness. There is no right CVA tenderness, left CVA tenderness, guarding or rebound.     Comments: Abdomen soft with TTP throughout the RLQ. Positive Psoas sign. Negative Rovsing's. Remainder of abdomen is nontender. No rebound or guarding. No CVA TTP.    Musculoskeletal:        General: Normal range of motion.     Cervical back: Normal range of motion.   Skin:    General: Skin is warm and dry.     Findings: No rash.   Neurological:     Mental Status: He is alert. Mental status is at baseline.   Psychiatric:  Mood and Affect: Mood normal.        Behavior: Behavior normal.      No results found for this visit on 06/16/24.   Assessment: Braeden was seen today for nausea and headache.  Diagnoses and all orders for this visit:  RLQ abdominal pain -     POC Urinalysis Auto without Microscopic  Nausea and vomiting, unspecified vomiting type  Acute nonintractable headache, unspecified headache type     Assessment & Plan 1. Right lower abdominal pain: Acute. - RLQ abd pain with N/V that began yesterday as well as mild headache. On arrival to UC VSS. Pt appears to be in NAD. He initially did not mention his abdominal pain and states it felt more like cramping however he has point TTP to the RLQ as well as positive psoas sign.  Question possible appendicitis vs kidney stone pain however denies any urinary symptoms. No signs of hernia causing something.   - Urine test to check for hematuria; no signs of blood, leuks, or nitrites  - Given RLQ pain, nausea, vomiting recommend ED eval      Plan: Home Care    Symptomatic management discussed.  Patient was given verbal and written instructions on symptoms that necessitate return to the UC/ED, and instructed to f/u w/ UC or PCP if not improving in expected timeframe.   Patient/parent has been instructed on RX/OTC medications, dosages, side effects, and possible interactions as associated with each diagnosis in my impression and plan above.   Patient education (verbal/handout) given on diagnosis, pathophysiology, treatment of diagnosis, side effects of medication use for treatment, restrictions while taking medication, supportives measures such as staying hydrated.   Red Flags associated with diagnosis/es were reviewed and patient instructed on action plan if red flags develop.   They have been instructed that if symptoms worsen or red flags develop they should return to Urgent Care, go to the nearest ED, or activate EMS/911.     Patient and/or parent/guardian (if applicable) agreed with plan and voiced understanding.  No barriers to adherence perceived by myself.  During this patient encounter if the patient presented with respiratory complaints and any concern for possible COVID, the patient was wearing a mask. In these cases, throughout the encounter, I was wearing at least a surgical mask as well myself.     Portions of this note may have been dictated using Dragon dictation software/hardware and may contain grammatical or spelling errors.   Electronically signed by Morgan Browns  Thu 06/16/2024 11:39 AM         [1] No Known Allergies [2] Patient Active Problem List Diagnosis  . Alcohol use disorder, severe, dependence    (CMD)

## 2024-06-16 NOTE — ED Provider Notes (Signed)
 Emergency Department Provider Note   I have reviewed the triage vital signs and the nursing notes.   HISTORY  Chief Complaint Abdominal Pain   HPI Mitchell Skinner. Stoiber is a 33 y.o. male with past history of asthma presents emergency department with nausea, vomiting, right side abdominal pain.  Patient works in a [employer], [employer] and had an episode of vomiting.  He was sent home where he continued to have nausea along with headache and fatigue.  He went to urgent care and during their exam had some right lower quadrant tenderness.  He was advised to present to the ED for evaluation of possible appendicitis.  No prior abdominal surgeries.  He states that without palpation of the area it is not particularly painful.    Past Medical History:  Diagnosis Date   Asthma     Review of Systems  Constitutional: No fever/chills Cardiovascular: Denies chest pain. Respiratory: Denies shortness of breath. Gastrointestinal: Positive abdominal pain. Positive nausea and vomiting.  Musculoskeletal: Negative for back pain. Skin: Negative for rash. Neurological: Negative for headaches.   ____________________________________________   PHYSICAL EXAM:  VITAL SIGNS: ED Triage Vitals  Encounter Vitals Group     BP 06/16/24 1232 125/87     Pulse Rate 06/16/24 1232 77     Resp 06/16/24 1232 18     Temp 06/16/24 1232 98.2 F (36.8 C)     Temp src --      SpO2 06/16/24 1232 99 %     Weight 06/16/24 1318 210 lb (95.3 kg)     Height 06/16/24 1318 5' 10 (1.778 m)   Constitutional: Alert and oriented. Well appearing and in no acute distress. Eyes: Conjunctivae are normal.  Head: Atraumatic. Nose: No congestion/rhinnorhea. Mouth/Throat: Mucous membranes are moist.   Neck: No stridor.   Cardiovascular: Good peripheral circulation.  Respiratory: Normal respiratory effort. Gastrointestinal: Soft with mild tenderness in the right lower abdomen.  No rebound or guarding.  No distention.  Musculoskeletal:  No gross deformities of extremities. Neurologic:  Normal speech and language.  Skin:  Skin is warm, dry and intact. No rash noted.  ____________________________________________   LABS (all labs ordered are listed, but only abnormal results are displayed)  Labs Reviewed  LIPASE, BLOOD  COMPREHENSIVE METABOLIC PANEL WITH GFR  CBC  URINALYSIS, ROUTINE W REFLEX MICROSCOPIC   ____________________________________________  RADIOLOGY  No results found.  ____________________________________________   PROCEDURES  Procedure(s) performed:   Procedures   ____________________________________________   INITIAL IMPRESSION / ASSESSMENT AND PLAN / ED COURSE  Pertinent labs & imaging results that were available during my care of the patient were reviewed by me and considered in my medical decision making (see chart for details).   This patient is Presenting for Evaluation of abdominal pain, which does require a range of treatment options, and is a complaint that involves a high risk of morbidity and mortality.  The Differential Diagnoses includes but is not exclusive to acute appendicitis, renal colic, testicular torsion, urinary tract infection, prostatitis,  diverticulitis, small bowel obstruction, colitis, abdominal aortic aneurysm, gastroenteritis, constipation etc.   Critical Interventions-    Medications  sodium chloride  0.9 % bolus 1,000 mL (has no administration in time range)    Reassessment after intervention: No worsening symptoms.   Clinical Laboratory Tests Ordered, included CBC without leukocytosis.  LFTs and bilirubin normal.  No AKI.  Radiologic Tests Ordered, included CT abdomen/pelvis. I independently interpreted the images and agree with radiology interpretation.   Cardiac Monitor Tracing which shows  NSR.    Social Determinants of Health Risk patient is a smoker.   Consult complete with  Medical Decision Making: Summary:  The patient presents emergency  department for evaluation of nausea.  Some mild tenderness in the right lower quadrant on exam.  Plan for CT.  Reevaluation with update and discussion with   ***Considered admission***  Patient's presentation is most consistent with acute presentation with potential threat to life or bodily function.   Disposition:   ____________________________________________  FINAL CLINICAL IMPRESSION(S) / ED DIAGNOSES  Final diagnoses:  None     NEW OUTPATIENT MEDICATIONS STARTED DURING THIS VISIT:  New Prescriptions   No medications on file    Note:  This document was prepared using Dragon voice recognition software and may include unintentional dictation errors.  Fonda Law, MD, [facility] Emergency Medicine

## 2024-06-16 NOTE — Discharge Instructions (Signed)
You have been seen in the Emergency Department (ED) today for nausea and vomiting.  Your work up today has not shown a clear cause for your symptoms. °You have been prescribed Zofran; please use as prescribed as needed for your nausea. ° °Follow up with your doctor as soon as possible regarding today?s emergent visit and your symptoms of nausea.  ° °Return to the ED if you develop abdominal, bloody vomiting, bloody diarrhea, if you are unable to tolerate fluids due to vomiting, or if you develop other symptoms that concern you. ° °

## 2024-06-16 NOTE — ED Triage Notes (Signed)
 Patient c/o RLQ pain since this morning, headache x day and a half. UC sent him for eval for appendicitis, no fever, endorses N/V today and feeling generally unwell.

## 2024-08-09 ENCOUNTER — Encounter (HOSPITAL_COMMUNITY): Payer: Self-pay

## 2024-08-09 ENCOUNTER — Telehealth (HOSPITAL_COMMUNITY): Admitting: Psychiatry

## 2024-08-09 ENCOUNTER — Encounter (HOSPITAL_COMMUNITY): Payer: Self-pay | Admitting: Psychiatry

## 2024-08-09 VITALS — Wt 216.0 lb

## 2024-08-09 DIAGNOSIS — F4310 Post-traumatic stress disorder, unspecified: Secondary | ICD-10-CM

## 2024-08-09 DIAGNOSIS — F1011 Alcohol abuse, in remission: Secondary | ICD-10-CM

## 2024-08-09 DIAGNOSIS — F331 Major depressive disorder, recurrent, moderate: Secondary | ICD-10-CM

## 2024-08-09 MED ORDER — BUPROPION HCL ER (XL) 150 MG PO TB24: 150.0000 mg | ORAL_TABLET | Freq: Every day | ORAL | 2 refills | Status: DC

## 2024-08-09 MED ORDER — HYDROXYZINE HCL 10 MG PO TABS: 10.0000 mg | ORAL_TABLET | Freq: Every evening | ORAL | 0 refills | Status: AC | PRN
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
## 2024-11-07 ENCOUNTER — Telehealth (HOSPITAL_COMMUNITY): Admitting: Psychiatry

## 2024-11-07 ENCOUNTER — Encounter (HOSPITAL_COMMUNITY): Payer: Self-pay | Admitting: Psychiatry

## 2024-11-07 VITALS — Wt 216.0 lb

## 2024-11-07 DIAGNOSIS — F1011 Alcohol abuse, in remission: Secondary | ICD-10-CM

## 2024-11-07 DIAGNOSIS — F4310 Post-traumatic stress disorder, unspecified: Secondary | ICD-10-CM

## 2024-11-07 DIAGNOSIS — F331 Major depressive disorder, recurrent, moderate: Secondary | ICD-10-CM

## 2024-11-07 MED ORDER — BUPROPION HCL ER (XL) 150 MG PO TB24: 150.0000 mg | ORAL_TABLET | Freq: Every day | ORAL | 2 refills | Status: AC
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
## 2024-11-10 ENCOUNTER — Telehealth (HOSPITAL_COMMUNITY): Payer: Self-pay | Admitting: *Deleted

## 2024-11-10 NOTE — Telephone Encounter (Signed)
 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 Unable to LVM as VM is full.

## 2025-02-06 ENCOUNTER — Telehealth (HOSPITAL_COMMUNITY): Admitting: Psychiatry
